# Patient Record
Sex: Male | Born: 1960 | Race: White | Hispanic: No | Smoking: Current every day smoker
Health system: Southern US, Community
[De-identification: ages and names within clinical notes are randomized; demographics above are authoritative.]

## PROBLEM LIST (undated history)

## (undated) DIAGNOSIS — N2889 Other specified disorders of kidney and ureter: Secondary | ICD-10-CM

## (undated) DIAGNOSIS — I219 Acute myocardial infarction, unspecified: Secondary | ICD-10-CM

## (undated) DIAGNOSIS — I509 Heart failure, unspecified: Secondary | ICD-10-CM

## (undated) DIAGNOSIS — E119 Type 2 diabetes mellitus without complications: Secondary | ICD-10-CM

## (undated) DIAGNOSIS — Z86718 Personal history of other venous thrombosis and embolism: Secondary | ICD-10-CM

## (undated) DIAGNOSIS — I10 Essential (primary) hypertension: Secondary | ICD-10-CM

## (undated) DIAGNOSIS — E785 Hyperlipidemia, unspecified: Secondary | ICD-10-CM

## (undated) HISTORY — PX: CARDIAC DEFIBRILLATOR PLACEMENT: SHX171

## (undated) HISTORY — PX: HERNIA REPAIR: SHX51

---

## 2014-04-12 ENCOUNTER — Emergency Department: Payer: Self-pay | Admitting: Emergency Medicine

## 2014-04-18 ENCOUNTER — Emergency Department: Payer: Self-pay | Admitting: Internal Medicine

## 2015-04-27 ENCOUNTER — Other Ambulatory Visit: Payer: Self-pay

## 2015-04-27 ENCOUNTER — Emergency Department
Admission: EM | Admit: 2015-04-27 | Discharge: 2015-04-27 | Discharge status: Against Medical Advice | Payer: Medicaid Other | Attending: Emergency Medicine | Admitting: Emergency Medicine

## 2015-04-27 ENCOUNTER — Emergency Department: Payer: Medicaid Other

## 2015-04-27 ENCOUNTER — Encounter: Payer: Self-pay | Admitting: Emergency Medicine

## 2015-04-27 DIAGNOSIS — I252 Old myocardial infarction: Secondary | ICD-10-CM | POA: Insufficient documentation

## 2015-04-27 DIAGNOSIS — R7989 Other specified abnormal findings of blood chemistry: Secondary | ICD-10-CM | POA: Diagnosis not present

## 2015-04-27 DIAGNOSIS — Z880 Allergy status to penicillin: Secondary | ICD-10-CM | POA: Diagnosis not present

## 2015-04-27 DIAGNOSIS — Y998 Other external cause status: Secondary | ICD-10-CM | POA: Diagnosis not present

## 2015-04-27 DIAGNOSIS — Y9389 Activity, other specified: Secondary | ICD-10-CM | POA: Insufficient documentation

## 2015-04-27 DIAGNOSIS — I10 Essential (primary) hypertension: Secondary | ICD-10-CM | POA: Diagnosis not present

## 2015-04-27 DIAGNOSIS — R778 Other specified abnormalities of plasma proteins: Secondary | ICD-10-CM

## 2015-04-27 DIAGNOSIS — R079 Chest pain, unspecified: Secondary | ICD-10-CM

## 2015-04-27 DIAGNOSIS — Y9289 Other specified places as the place of occurrence of the external cause: Secondary | ICD-10-CM | POA: Diagnosis not present

## 2015-04-27 DIAGNOSIS — S299XXA Unspecified injury of thorax, initial encounter: Secondary | ICD-10-CM | POA: Insufficient documentation

## 2015-04-27 DIAGNOSIS — W1839XA Other fall on same level, initial encounter: Secondary | ICD-10-CM | POA: Diagnosis not present

## 2015-04-27 DIAGNOSIS — Z720 Tobacco use: Secondary | ICD-10-CM | POA: Diagnosis not present

## 2015-04-27 DIAGNOSIS — E119 Type 2 diabetes mellitus without complications: Secondary | ICD-10-CM | POA: Diagnosis not present

## 2015-04-27 HISTORY — DX: Acute myocardial infarction, unspecified: I21.9

## 2015-04-27 HISTORY — DX: Heart failure, unspecified: I50.9

## 2015-04-27 HISTORY — DX: Personal history of other venous thrombosis and embolism: Z86.718

## 2015-04-27 HISTORY — DX: Essential (primary) hypertension: I10

## 2015-04-27 HISTORY — DX: Type 2 diabetes mellitus without complications: E11.9

## 2015-04-27 LAB — CBC
HCT: 36.8 % — ABNORMAL LOW (ref 40.0–52.0)
HEMOGLOBIN: 12.5 g/dL — AB (ref 13.0–18.0)
MCH: 31.2 pg (ref 26.0–34.0)
MCHC: 34 g/dL (ref 32.0–36.0)
MCV: 91.9 fL (ref 80.0–100.0)
PLATELETS: 221 10*3/uL (ref 150–440)
RBC: 4.01 MIL/uL — ABNORMAL LOW (ref 4.40–5.90)
RDW: 13.3 % (ref 11.5–14.5)
WBC: 9.7 10*3/uL (ref 3.8–10.6)

## 2015-04-27 LAB — BASIC METABOLIC PANEL
ANION GAP: 9 (ref 5–15)
BUN: 14 mg/dL (ref 6–20)
CO2: 23 mmol/L (ref 22–32)
Calcium: 8.8 mg/dL — ABNORMAL LOW (ref 8.9–10.3)
Chloride: 99 mmol/L — ABNORMAL LOW (ref 101–111)
Creatinine, Ser: 0.88 mg/dL (ref 0.61–1.24)
GFR calc non Af Amer: >60 mL/min (ref >60)
GLUCOSE: 367 mg/dL — AB (ref 65–99)
POTASSIUM: 4.2 mmol/L (ref 3.5–5.1)
SODIUM: 131 mmol/L — AB (ref 135–145)

## 2015-04-27 LAB — TROPONIN I: Troponin I: 0.11 ng/mL — ABNORMAL HIGH (ref <0.031)

## 2015-04-27 MED ORDER — ASPIRIN 81 MG PO CHEW
324.0000 mg | CHEWABLE_TABLET | Freq: Once | ORAL | Status: AC
  Administered 2015-04-27: 324 mg via ORAL
  Filled 2015-04-27: qty 4

## 2015-04-27 NOTE — ED Notes (Signed)
Pt fell off the top bunk on Sunday , pt with complaining of continuous tingling sensation to ICD region, pt does not recall being shocked.

## 2015-04-27 NOTE — Discharge Instructions (Signed)
As we discussed, your chest pain and your blood work are concerning that you are having a heart attack. Furthermore we did discuss the possibility of death with this condition. You did understand the risk of leaving the hospital at this point and that you could have a massive heart attack or arrhythmia that could lead to death. Please follow-up with your heart doctor as soon as possible. In addition you are always welcome to return to this emergency department if you have changed your mind. Myocardial Infarction A myocardial infarction (MI) is damage to the heart that is not reversible. It is also called a heart attack. An MI usually occurs when a heart (coronary) artery becomes blocked or narrowed. This cuts off the blood supply to the heart. When one or more of the heart (coronary) arteries becomes blocked, that area of the heart begins to die. This causes pain felt during an MI.  If you think you might be having an MI, call your local emergency services immediately (911 in [place] It is recommended that you chew and swallow 3 non-enteric coated baby aspirin if you do not have an aspirin allergy. Do not drive yourself to the hospital or wait to see if your symptoms go away. The sooner MI is treated, the greater the amount of heart muscle saved. Time is muscle. It can save your life. CAUSES  An MI can occur from:  A gradual buildup of a fatty substance called plaque. When plaque builds up in the arteries, this condition is called atherosclerosis. This buildup can block or reduce the blood supply to the heart artery(s).  A sudden plaque rupture within a heart artery that causes a blood clot (thrombus). A blood clot can block the heart artery which does not allow blood flow to the heart.  A severe tightening (spasm) of the heart artery. This is a less common cause of a heart attack. When a heart artery spasms, it cuts off blood flow through the artery. Spasms can occur in heart arteries that do not have  atherosclerosis. RISK FACTORS People at risk for an MI usually have one or more risk factors, such as:  High blood pressure.  High cholesterol.  Smoking.  Gender. Men have a higher heart attack risk.  Overweight/obesity.  Age.  Family history.  Lack of exercise.  Diabetes.  Stress.  Excessive alcohol use.  Street drug use (cocaine and methamphetamines). SYMPTOMS  MI symptoms can vary, such as:  In both men and women, MI symptoms can include the following:  Chest pain. The chest pain may feel like a crushing, squeezing, or "pressure" type feeling. MI pain can be "referred," meaning pain can be caused in one part of the body but felt in another part of the body. Referred MI pain may occur in the left arm, neck, or jaw. Pain may even be felt in the right arm.  Shortness of breath (dyspnea).  Heartburn or indigestion with or without vomiting, shortness of breath, or sweating (diaphoresis).  Sudden, cold sweats.  Sudden lightheadedness.  Upper back pain.  Women can have unique MI symptoms, such as:  Unexplained feelings of nervousness or anxiety.  Discomfort between the shoulder blades (scapula) or upper back.  Tingling in the hands and arms.  In elderly people (regardless of gender), MI symptoms can be subtle, such as:  Sweating (diaphoresis).  Shortness of breath (dyspnea).  General tiredness (fatigue) or not feeling well (malaise). DIAGNOSIS  Diagnosis of an MI involves several tests such as:  An  assessment of your vital signs such as heart rhythm, blood pressure, respiratory rate, and oxygen level.  An EKG (ECG) to look at the electrical activity of your heart.  Blood tests called cardiac markers are drawn at scheduled times to measure proteins or enzymes released by the damaged heart muscle.  A chest X-ray.  An echocardiogram to evaluate heart motion and blood flow.  Coronary angiography (cardiac catheterization). This is a diagnostic procedure  to look at the heart arteries. TREATMENT  Acute Intervention. For an MI, the national standard in the [place] is to have an acute intervention in under 90 minutes from the time you get to the hospital. An acute intervention is a special procedure to open up the heart arteries. It is done in a treatment room called a "catheterization lab" (cath lab). Some hospitals do no have a cath lab. If you are having an MI and the hospital does not have a cath lab, the standard is to transport you to a hospital that has one. In the cath lab, acute intervention includes:  Angioplasty. An angioplasty involves inserting a thin, flexible tube (catheter) into an artery in either your groin or wrist. The catheter is threaded to the heart arteries. A balloon at the end of the catheter is inflated to open a narrowed or blocked heart artery. During an angioplasty procedure, a small mesh tube (stent) may be used to keep the heart artery open. Depending on your condition and health history, one of two types of stents may be placed:  Drug-eluting stent (DES). A DES is coated with a medicine to prevent scar tissue from growing over the stent. With drug-eluting stents, blood thinning medicine will need to be taken for up to a year.  Bare metal stent. This type of stent has no special coating to keep tissue from growing over it. This type of stent is used if you cannot take blood thinning medicine for a prolonged time or you need surgery in the near future. After a bare metal stent is placed, blood thinning medicine will need to be taken for about a month.  If you are taking blood thinning medicine (anti-platelet therapy) after stent placement, do not stop taking it unless your caregiver says it is okay to do so. Make sure you understand how long you need to take the medicine. Surgical Intervention  If an acute intervention is not successful, surgery may be needed:  Open heart surgery (coronary artery bypass graft, CABG).  CABG takes a vein (saphenous vein) from your leg. The vein is then attached to the blocked heart artery which bypasses the blockage. This then allows blood flow to the heart muscle. Additional Interventions  A "clot buster" medicine (thrombolytic) may be given. This medicine can help break up a clot in the heart artery. This medicine may be given if a person cannot get to a cath lab right away.  Intra-aortic balloon pump (IABP). If you have suffered a very severe MI and are too unstable to go to the cath lab or to surgery, an IABP may be used. This is a temporary mechanical device used to increase blood flow to the heart and reduce the workload of the heart until you are stable enough to go to the cath lab or surgery. HOME CARE INSTRUCTIONS After an MI, you may need the following:  Medicine. Take medicine as directed by your caregiver. Medicines after an MI may:  Keep your blood from clotting easily (blood thinners).  Control your blood pressure.  Help lower your cholesterol.  Control abnormal heart rhythms.  Lifestyle changes. Under the guidance of your caregiver, lifestyle changes include:  Quitting smoking, if you smoke. Your caregiver can help you quit.  Being physically active.  Maintaining a healthy weight.  Eating a heart healthy diet. A dietitian can help you learn healthy eating options.  Managing diabetes.  Reducing stress.  Limiting alcohol intake. SEEK IMMEDIATE MEDICAL CARE IF:   You have severe chest pain, especially if the pain is crushing or pressure-like and spreads to the arms, back, neck, or jaw. This is an emergency. Do not wait to see if the pain will go away. Get medical help at once. Call your local emergency services (911 in the [place] Do not drive yourself to the hospital.  You have shortness of breath during rest, sleep, or with activity.  You have sudden sweating or clammy skin.  You feel sick to your stomach (nauseous) and throw up (vomit).  You  suddenly become lightheaded or dizzy.  You feel your heart beating rapidly or you notice "skipped" beats. MAKE SURE YOU:   Understand these instructions.  Will watch your condition.  Will get help right away if you are not doing well or get worse. Document Released: 09/17/2005 Document Revised: 09/22/2013 Document Reviewed: 11/20/2013 [facility] Patient Information 2015 [place], [place]. This information is not intended to replace advice given to you by your health care provider. Make sure you discuss any questions you have with your health care provider.

## 2015-04-27 NOTE — ED Provider Notes (Signed)
[facility] Emergency Department Provider Note  ____________________________________________  Time seen: 1225  I have reviewed the triage vital signs and the nursing notes.   HISTORY  Chief Complaint AICD Problem   History limited by: Not Limited   HPI Mario Gallagher is a 54 y.o. male who presents to the emergency department today because of concerns of chest pain. The patient states that he fell 3 days ago. He states he fell onto his left elbow and arm and felt as if he almost dislocated it. He states since that time he has pain in his left chest. He describes it as a sometimes sharp and sometimes tingling pain. It has been fairly constant. He describes it as starting mid axillary and radiating around to the front of his left chest. He states that he does have an AICD that was placed in June of this year secondary to heart failure. He states he does have a history of heart attacks.He has not had any recent fevers.   Past Medical History  Diagnosis Date  . Hypertension   . Diabetes mellitus without complication   . CHF (congestive heart failure)   . Acute MI   . H/O blood clots     There are no active problems to display for this patient.   Past Surgical History  Procedure Laterality Date  . Cardiac defibrillator placement    . Hernia repair      No current outpatient prescriptions on file.  Allergies Codeine; Penicillins; and Tramadol  No family history on file.  Social History History  Substance Use Topics  . Smoking status: Current Every Day Smoker  . Smokeless tobacco: Not on file  . Alcohol Use: Not on file    Review of Systems  Constitutional: Negative for fever. Cardiovascular: Positive for chest pain. Respiratory: Negative for shortness of breath. Gastrointestinal: Negative for abdominal pain, vomiting and diarrhea. Genitourinary: Negative for dysuria. Musculoskeletal: Negative for back pain. Skin: Negative for  rash. Neurological: Negative for headaches, focal weakness or numbness.  10-point ROS otherwise negative.  ____________________________________________   PHYSICAL EXAM:  VITAL SIGNS: ED Triage Vitals  Enc Vitals Group     BP 04/27/15 1130 143/72 mmHg     Pulse Rate 04/27/15 1130 85     Resp 04/27/15 1130 18     Temp 04/27/15 1130 98.2 F (36.8 C)     Temp Source 04/27/15 1130 Oral     SpO2 04/27/15 1130 99 %     Weight 04/27/15 1130 177 lb (80.287 kg)     Height 04/27/15 1130 5\' 8"  (1.727 m)     Head Cir --      Peak Flow --      Pain Score 04/27/15 1130 8   Constitutional: Alert and oriented. Well appearing and in no distress. Eyes: Conjunctivae are normal. PERRL. Normal extraocular movements. ENT   Head: Normocephalic and atraumatic.   Nose: No congestion/rhinnorhea.   Mouth/Throat: Mucous membranes are moist.   Neck: No stridor. Hematological/Lymphatic/Immunilogical: No cervical lymphadenopathy. Cardiovascular: Normal rate, regular rhythm.  No murmurs, rubs, or gallops. Respiratory: Normal respiratory effort without tachypnea nor retractions. Breath sounds are clear and equal bilaterally. No wheezes/rales/rhonchi. Gastrointestinal: Soft and nontender. No distention.  Genitourinary: Deferred Musculoskeletal: Normal range of motion in all extremities. No joint effusions.  No lower extremity tenderness nor edema. Neurologic:  Normal speech and language. No gross focal neurologic deficits are appreciated. Speech is normal.  Skin:  Skin is warm, dry and intact. No rash  noted. Psychiatric: Mood and affect are normal. Speech and behavior are normal. Patient exhibits appropriate insight and judgment.  ____________________________________________    LABS (pertinent positives/negatives)  Labs Reviewed  BASIC METABOLIC PANEL - Abnormal; Notable for the following:    Sodium 131 (*)    Chloride 99 (*)    Glucose, Bld 367 (*)    Calcium 8.8 (*)    All other  components within normal limits  TROPONIN I - Abnormal; Notable for the following:    Troponin I 0.11 (*)    All other components within normal limits  CBC - Abnormal; Notable for the following:    RBC 4.01 (*)    Hemoglobin 12.5 (*)    HCT 36.8 (*)    All other components within normal limits     ____________________________________________   EKG  I, Phineas Semen, attending physician, personally viewed and interpreted this EKG  EKG Time: 1133 Rate: 87 Rhythm: paced Axis: normal Intervals: qtc 498 QRS: narrow ST changes: no st elevation  ____________________________________________    RADIOLOGY  Chest x-ray IMPRESSION: No active cardiopulmonary disease. ____________________________________________   PROCEDURES  Procedure(s) performed: None  Critical Care performed: No  ____________________________________________   INITIAL IMPRESSION / ASSESSMENT AND PLAN / ED COURSE  Pertinent labs & imaging results that were available during my care of the patient were reviewed by me and considered in my medical decision making (see chart for details).  Patient presented to the emergency department today because of continued pain and tingling to his left chest after a fall. EKG without any concerning findings. Patient's troponin was elevated. I did have a long discussion with the patient about my concern that he is suffering heart damage. At this point however he states that he only wants his heart doctor at [facility] to take care of it. He requested that he be discharged from the hospital so his family could take him to [facility]. I did state that I was not comfortable with discharging him from the emergency department given his symptoms and elevated troponin. We did discuss possibility of sudden death either from a heart attack or dysrhythmia. I again strongly urged and recommended that the patient stay in the emergency department for admission and further evaluation. Patient verbalized  understanding of risks and again requested that he be discharged from the hospital. This point I will have the patient sign out AMA. We will encourage patient to follow-up immediately with [facility] cardiology team.  Of note, upon review of care everywhere I do not see any notes from [facility]. Patient had left prior to my discussion with him about this.  ____________________________________________   FINAL CLINICAL IMPRESSION(S) / ED DIAGNOSES  Final diagnoses:  Chest pain, unspecified chest pain type  Elevated troponin     Phineas Semen, MD 04/27/15 1704

## 2015-04-29 ENCOUNTER — Other Ambulatory Visit: Payer: Self-pay

## 2015-04-29 ENCOUNTER — Encounter: Payer: Self-pay | Admitting: Emergency Medicine

## 2015-04-29 ENCOUNTER — Emergency Department
Admission: EM | Admit: 2015-04-29 | Discharge: 2015-04-29 | Discharge status: Against Medical Advice | Payer: Medicaid Other | Attending: Emergency Medicine | Admitting: Emergency Medicine

## 2015-04-29 DIAGNOSIS — I10 Essential (primary) hypertension: Secondary | ICD-10-CM | POA: Insufficient documentation

## 2015-04-29 DIAGNOSIS — Y831 Surgical operation with implant of artificial internal device as the cause of abnormal reaction of the patient, or of later complication, without mention of misadventure at the time of the procedure: Secondary | ICD-10-CM | POA: Insufficient documentation

## 2015-04-29 DIAGNOSIS — Z880 Allergy status to penicillin: Secondary | ICD-10-CM | POA: Insufficient documentation

## 2015-04-29 DIAGNOSIS — R079 Chest pain, unspecified: Secondary | ICD-10-CM | POA: Diagnosis present

## 2015-04-29 DIAGNOSIS — F911 Conduct disorder, childhood-onset type: Secondary | ICD-10-CM | POA: Diagnosis not present

## 2015-04-29 DIAGNOSIS — T82198A Other mechanical complication of other cardiac electronic device, initial encounter: Secondary | ICD-10-CM | POA: Insufficient documentation

## 2015-04-29 DIAGNOSIS — E119 Type 2 diabetes mellitus without complications: Secondary | ICD-10-CM | POA: Insufficient documentation

## 2015-04-29 DIAGNOSIS — Z720 Tobacco use: Secondary | ICD-10-CM | POA: Diagnosis not present

## 2015-04-29 NOTE — ED Notes (Signed)
Pt states he was seen here 2 days ago and his ICD has been firing and causing pain.  States he has a f/u with his MD on next Friday.  Pt refuses EKG and repeat blood draw at triage.  States " this is some stupid shit, I just need my medicines".  Pt request refill percocet and klonopin.  Skin w/d with good color.

## 2015-04-29 NOTE — ED Provider Notes (Signed)
[facility] Emergency Department Provider Note     Time seen: ----------------------------------------- 12:43 PM on 04/29/2015 -----------------------------------------    I have reviewed the triage vital signs and the nursing notes.   HISTORY  Chief Complaint Chest Pain    HPI Mario Gallagher is a 54 y.o. male who presents ER stating that his AICD is firing and causing him pain. Patient requesting Percocets for pain and will not let anyonedraw blood or get vital signs. [facility] states[facility] was just seen 2 days ago and does not want anyone to look at his ICD.   Past Medical History  Diagnosis Date  . Hypertension   . Diabetes mellitus without complication   . CHF (congestive heart failure)   . Acute MI   . H/O blood clots     There are no active problems to display for this patient.   Past Surgical History  Procedure Laterality Date  . Cardiac defibrillator placement    . Hernia repair      Allergies Codeine; Penicillins; and Tramadol  Social History History  Substance Use Topics  . Smoking status: Current Every Day Smoker  . Smokeless tobacco: Not on file  . Alcohol Use: No    Review of Systems Constitutional: Negative for fever. Eyes: Negative for visual changes. ENT: Negative for sore throat. Cardiovascular: Positive for chest pain Respiratory: Negative for shortness of breath. Gastrointestinal: Negative for abdominal pain, vomiting and diarrhea. Genitourinary: Negative for dysuria. Musculoskeletal: Negative for back pain. Skin: Negative for rash. Neurological: Negative for headaches, focal weakness or numbness.  10-point ROS otherwise negative.  ____________________________________________   PHYSICAL EXAM:  VITAL SIGNS: ED Triage Vitals  Enc Vitals Group     BP 04/29/15 1233 132/80 mmHg     Pulse Rate 04/29/15 1233 88     Resp 04/29/15 1233 18     Temp 04/29/15 1233 98.5 F (36.9 C)   Temp Source 04/29/15 1233 Oral     SpO2 04/29/15 1233 98 %     Weight 04/29/15 1233 177 lb (80.287 kg)     Height 04/29/15 1233  (1.727 m)     Head Cir --      Peak Flow --      Pain Score 04/29/15 1237 10     Pain Loc --      Pain Edu? --      Excl. in GC? --     Constitutional: Alert and oriented. Well appearing and in no distress. Eyes: Conjunctivae are normal. PERRL. Normal extraocular movements. ENT   Head: Normocephalic and atraumatic.   Nose: No congestion/rhinnorhea.   Mouth/Throat: Mucous membranes are moist.   Neck: No stridor. Cardiovascular: Normal rate, regular rhythm. Normal and symmetric distal pulses are present in all extremities. No murmurs, rubs, or gallops. Respiratory: Normal respiratory effort without tachypnea nor retractions. Breath sounds are clear and equal bilaterally. No wheezes/rales/rhonchi. Gastrointestinal: Soft and nontender. No distention. No abdominal bruits.  Musculoskeletal: Nontender with normal range of motion in all extremities. No joint effusions.  No lower extremity tenderness nor edema. Neurologic:  Normal speech and language. No gross focal neurologic deficits are appreciated. Speech is normal. No gait instability. Skin:  Skin is warm, dry and intact. No rash noted. Psychiatric: Patient with aggressive behavior, very antagonistic. ____________________________________________  EKG: Interpreted by me. Rate is 79 bpm, atrial sensing ventricular paced rhythm.  ____________________________________________  ED COURSE:  Pertinent labs & imaging results that were available during my care of the  patient were reviewed by me and considered in my medical decision making (see chart for details). I attempted to talk patient into having his ICD interrogated the patient began cursing and threatening me. Patient repeatedly asking for a prescription for narcotics to go back to the [facility] with. He is advised we do not manage  chronic pain and that this would have to come from his primary care doctor. Patient abruptly ripped off his telemetry leads and left against medical device ____________________________________________     FINAL ASSESSMENT AND PLAN  Chest pain, ICD firing  Plan: Patient with labs and imaging as dictated above. Patient again refused any treatment here. Advised that we could not guarantee his safety that he needed ICD interrogation. Patient was cursing and threatening me with lawsuits as he walked out of the ER. Police was notified.   Emily Filbert, MD   Emily Filbert, MD 04/29/15 [phone]

## 2015-04-29 NOTE — ED Notes (Signed)
Pt walked out AMA while MD at bedside. Pt said to RN while walking out "I ain't signing any of your shit" and walked out. Pt left at this time.

## 2015-04-29 NOTE — ED Notes (Signed)
MD Williams at bedside.  

## 2017-04-24 ENCOUNTER — Emergency Department
Admission: EM | Admit: 2017-04-24 | Discharge: 2017-04-24 | Disposition: A | Discharge status: Home / Self Care | Payer: Medicaid Other | Attending: Emergency Medicine | Admitting: Emergency Medicine

## 2017-04-24 ENCOUNTER — Encounter: Payer: Self-pay | Admitting: Emergency Medicine

## 2017-04-24 ENCOUNTER — Emergency Department: Payer: Medicaid Other

## 2017-04-24 DIAGNOSIS — F17210 Nicotine dependence, cigarettes, uncomplicated: Secondary | ICD-10-CM | POA: Diagnosis not present

## 2017-04-24 DIAGNOSIS — N451 Epididymitis: Secondary | ICD-10-CM | POA: Insufficient documentation

## 2017-04-24 DIAGNOSIS — I509 Heart failure, unspecified: Secondary | ICD-10-CM | POA: Insufficient documentation

## 2017-04-24 DIAGNOSIS — R079 Chest pain, unspecified: Secondary | ICD-10-CM | POA: Diagnosis present

## 2017-04-24 DIAGNOSIS — R109 Unspecified abdominal pain: Secondary | ICD-10-CM | POA: Insufficient documentation

## 2017-04-24 DIAGNOSIS — I110 Hypertensive heart disease with heart failure: Secondary | ICD-10-CM | POA: Insufficient documentation

## 2017-04-24 DIAGNOSIS — I252 Old myocardial infarction: Secondary | ICD-10-CM | POA: Insufficient documentation

## 2017-04-24 DIAGNOSIS — E119 Type 2 diabetes mellitus without complications: Secondary | ICD-10-CM | POA: Diagnosis not present

## 2017-04-24 DIAGNOSIS — N50819 Testicular pain, unspecified: Secondary | ICD-10-CM

## 2017-04-24 HISTORY — DX: Other specified disorders of kidney and ureter: N28.89

## 2017-04-24 HISTORY — DX: Hyperlipidemia, unspecified: E78.5

## 2017-04-24 LAB — CBC
HCT: 40.2 % (ref 40.0–52.0)
Hemoglobin: 13.8 g/dL (ref 13.0–18.0)
MCH: 31.8 pg (ref 26.0–34.0)
MCHC: 34.3 g/dL (ref 32.0–36.0)
MCV: 93 fL (ref 80.0–100.0)
PLATELETS: 249 10*3/uL (ref 150–440)
RBC: 4.33 MIL/uL — ABNORMAL LOW (ref 4.40–5.90)
RDW: 13.6 % (ref 11.5–14.5)
WBC: 10.1 10*3/uL (ref 3.8–10.6)

## 2017-04-24 LAB — URINE DRUG SCREEN, QUALITATIVE (ARMC ONLY)
Amphetamines, Ur Screen: NOT DETECTED
BENZODIAZEPINE, UR SCRN: NOT DETECTED
Barbiturates, Ur Screen: NOT DETECTED
CANNABINOID 50 NG, UR ~~LOC~~: POSITIVE — AB
Cocaine Metabolite,Ur ~~LOC~~: NOT DETECTED
MDMA (Ecstasy)Ur Screen: NOT DETECTED
Methadone Scn, Ur: NOT DETECTED
Opiate, Ur Screen: NOT DETECTED
Phencyclidine (PCP) Ur S: NOT DETECTED
Tricyclic, Ur Screen: NOT DETECTED

## 2017-04-24 LAB — BASIC METABOLIC PANEL
Anion gap: 10 (ref 5–15)
BUN: 19 mg/dL (ref 6–20)
CO2: 24 mmol/L (ref 22–32)
CREATININE: 1.05 mg/dL (ref 0.61–1.24)
Calcium: 9.6 mg/dL (ref 8.9–10.3)
Chloride: 97 mmol/L — ABNORMAL LOW (ref 101–111)
GFR calc non Af Amer: >60 mL/min (ref >60)
Glucose, Bld: 531 mg/dL (ref 65–99)
Potassium: 4.3 mmol/L (ref 3.5–5.1)
SODIUM: 131 mmol/L — AB (ref 135–145)

## 2017-04-24 LAB — GLUCOSE, CAPILLARY
GLUCOSE-CAPILLARY: 190 mg/dL — AB (ref 65–99)
Glucose-Capillary: 512 mg/dL (ref 65–99)

## 2017-04-24 LAB — TROPONIN I: Troponin I: 0.03 ng/mL (ref <0.03)

## 2017-04-24 LAB — ETHANOL

## 2017-04-24 MED ORDER — TRAMADOL HCL 50 MG PO TABS
ORAL_TABLET | ORAL | Status: AC
  Filled 2017-04-24: qty 1

## 2017-04-24 MED ORDER — ONDANSETRON HCL 4 MG/2ML IJ SOLN
4.0000 mg | Freq: Once | INTRAMUSCULAR | Status: AC
  Administered 2017-04-24: 4 mg via INTRAVENOUS
  Filled 2017-04-24: qty 2

## 2017-04-24 MED ORDER — IOPAMIDOL (ISOVUE-300) INJECTION 61%
100.0000 mL | Freq: Once | INTRAVENOUS | Status: AC | PRN
  Administered 2017-04-24: 100 mL via INTRAVENOUS

## 2017-04-24 MED ORDER — LEVOFLOXACIN 500 MG PO TABS: 500.0000 mg | ORAL_TABLET | Freq: Every day | ORAL | 0 refills | Status: AC

## 2017-04-24 MED ORDER — INSULIN ASPART 100 UNIT/ML ~~LOC~~ SOLN
8.0000 [IU] | Freq: Once | SUBCUTANEOUS | Status: AC
  Administered 2017-04-24: 8 [IU] via INTRAVENOUS
  Filled 2017-04-24: qty 1

## 2017-04-24 MED ORDER — MORPHINE SULFATE (PF) 2 MG/ML IV SOLN
2.0000 mg | Freq: Once | INTRAVENOUS | Status: AC
  Administered 2017-04-24: 2 mg via INTRAVENOUS
  Filled 2017-04-24: qty 1

## 2017-04-24 MED ORDER — OXYCODONE HCL 5 MG PO TABS: 5.0000 mg | ORAL_TABLET | Freq: Three times a day (TID) | ORAL | 0 refills | Status: AC | PRN

## 2017-04-24 MED ORDER — OXYCODONE-ACETAMINOPHEN 5-325 MG PO TABS: 1.0000 | ORAL_TABLET | Freq: Once | ORAL | Status: DC

## 2017-04-24 MED ORDER — OXYCODONE HCL 5 MG PO TABS
5.0000 mg | ORAL_TABLET | Freq: Once | ORAL | Status: AC
  Administered 2017-04-24: 5 mg via ORAL

## 2017-04-24 MED ORDER — SODIUM CHLORIDE 0.9 % IV BOLUS (SEPSIS)
500.0000 mL | Freq: Once | INTRAVENOUS | Status: AC
  Administered 2017-04-24: 500 mL via INTRAVENOUS

## 2017-04-24 MED ORDER — OXYCODONE HCL 5 MG PO TABS
ORAL_TABLET | ORAL | Status: AC
  Administered 2017-04-24: 5 mg via ORAL
  Filled 2017-04-24: qty 1

## 2017-04-24 NOTE — ED Triage Notes (Signed)
Pt to ED from home c/o chest pain x3 days.  States had n/v as well.  States pain down mid abd with hx of hernia.  Recently released from jail and has not been taking medication, insulin, or had pacemaker interrogated recently.

## 2017-04-24 NOTE — ED Provider Notes (Signed)
[facility] Emergency Department Provider Note   First MD Initiated Contact with Patient 04/24/17 [phone]     (approximate)  I have reviewed the triage vital signs and the nursing notes.  Patient is a very poor historian HISTORY  Chief Complaint Chest Pain    HPI Mario Gallagher is a 56 y.o. male presents to the emergency department with "belly pain that goes all the way to my nuts". Patient states that he believes his hernia has ruptured and is extended all the way down to his scrotum. Patient denies any chest pain. Patient does however admit to nausea and nonbloody vomiting seen at his current pain score is 10 out of 10. Patient states that he was recently released from jail and admits to being noncompliant with his medications including insulin.    Past Medical History:  Diagnosis Date  . Acute MI (HCC)   . CHF (congestive heart failure) (HCC)   . Diabetes mellitus without complication (HCC)   . H/O blood clots   . Hyperlipemia   . Hypertension   . Left kidney mass     There are no active problems to display for this patient.   Past Surgical History:  Procedure Laterality Date  . CARDIAC DEFIBRILLATOR PLACEMENT    . HERNIA REPAIR      Prior to Admission medications   Not on File    Allergies Codeine; Penicillins; and Tramadol  History reviewed. No pertinent family history.  Social History Social History  Substance Use Topics  . Smoking status: Current Every Day Smoker    Packs/day: 0.50    Types: Cigarettes  . Smokeless tobacco: Never Used  . Alcohol use No    Review of Systems Constitutional: No fever/chills Eyes: No visual changes. ENT: No sore throat. Cardiovascular: Denies chest pain. Respiratory: Denies shortness of breath. GastrointestinalPositive for abdominal pain nausea and vomiting  Genitourinary: Negative for dysuria. Positive for right scrotal pain Musculoskeletal: Negative for neck pain.  Negative for back  pain. Integumentary: Negative for rash. Neurological: Negative for headaches, focal weakness or numbness.   ____________________________________________   PHYSICAL EXAM:  VITAL SIGNS: ED Triage Vitals  Enc Vitals Group     BP 04/24/17 0022 (!) 144/66     Pulse Rate 04/24/17 0022 (!) 102     Resp 04/24/17 0022 20     Temp 04/24/17 0022 97.8 F (36.6 C)     Temp Source 04/24/17 0022 Oral     SpO2 04/24/17 0022 97 %     Weight 04/24/17 0015 90.7 kg (200 lb)     Height 04/24/17 0015 1.727 m (5\' 8" )     Head Circumference --      Peak Flow --      Pain Score 04/24/17 0015 7     Pain Loc --      Pain Edu? --      Excl. in GC? --    Constitutional: Alert and oriented. Well appearing and in no acute distress. Eyes: Conjunctivae are normal. PERRL. EOMI. Head: Atraumatic. Mouth/Throat: Mucous membranes are moist. Oropharynx non-erythematous. Neck: No stridor.   Cardiovascular: Normal rate, regular rhythm. Good peripheral circulation. Grossly normal heart sounds. Respiratory: Normal respiratory effort.  No retractions. Lungs CTAB. Gastrointestinal: Generalized tenderness with very mild palpation. No distention.  Genitourinary: Right testicular pain with palpation of the epididymis Musculoskeletal: No lower extremity tenderness nor edema. No gross deformities of extremities. Neurologic:  Normal speech and language. No gross focal neurologic deficits are appreciated.  Skin:  Skin is warm, dry and intact. No rash noted. Psychiatric: Mood and affect are normal. Speech and behavior are normal.  ____________________________________________   LABS (all labs ordered are listed, but only abnormal results are displayed)  Labs Reviewed  BASIC METABOLIC PANEL - Abnormal; Notable for the following:       Result Value   Sodium 131 (*)    Chloride 97 (*)    Glucose, Bld 531 (*)    All other components within normal limits  CBC - Abnormal; Notable for the following:    RBC 4.33 (*)     All other components within normal limits  TROPONIN I - Abnormal; Notable for the following:    Troponin I 0.03 (*)    All other components within normal limits  GLUCOSE, CAPILLARY - Abnormal; Notable for the following:    Glucose-Capillary 512 (*)    All other components within normal limits  URINE DRUG SCREEN, QUALITATIVE ([facility] ONLY) - Abnormal; Notable for the following:    Cannabinoid 50 Ng, Ur [place] POSITIVE (*)    All other components within normal limits  ETHANOL   ____________________________________________  EKG  ED ECG REPORT I, [place] N Emya Picado, the attending physician, personally viewed and interpreted this ECG.   Date: 04/24/2017  EKG Time: 12:14 AM  Rate: 103  Rhythm: Atrial paced rhythm  Axis: Normal  Intervals: Normal  ST&T Change: None  ____________________________________________  RADIOLOGY I, [place] Jonell Krontz, personally viewed and evaluated these images (plain radiographs) as part of my medical decision making, as well as reviewing the written report by the radiologist.  Dg Chest 2 View  Result Date: 04/24/2017 CLINICAL DATA:  Chest pain for 3 days, extending down into the mid abdomen. EXAM: CHEST  2 VIEW COMPARISON:  04/27/2015 FINDINGS: Grossly intact transvenous cardiac leads. The lungs are clear. The pulmonary vasculature is normal. Heart size is normal. Hilar and mediastinal contours are unremarkable. There is no pleural effusion. IMPRESSION: No active cardiopulmonary disease. Electronically Signed   By: Ellery Plunk M.D.   On: 04/24/2017 00:59   US Scrotum  Result Date: 04/24/2017 CLINICAL DATA:  Bilateral testicular pain for 1 week EXAM: SCROTAL ULTRASOUND DOPPLER ULTRASOUND OF THE TESTICLES TECHNIQUE: Complete ultrasound examination of the testicles, epididymis, and other scrotal structures was performed. Color and spectral Doppler ultrasound were also utilized to evaluate blood flow to the testicles. COMPARISON:  None. FINDINGS: Right testicle  Measurements: 4.0 x 2.4 x 2.9 cm. No mass or microlithiasis visualized. Left testicle Measurements: 4.3 x 2.3 x 2.7 cm. No mass or microlithiasis visualized. Right epididymis: Moderately enlarged. Markedly hyperemic on Doppler. No focal lesion. Left epididymis:  Normal in size and appearance. Hydrocele:  Small bilateral hydroceles. Varicocele:  None visualized. Pulsed Doppler interrogation of both testes demonstrates normal low resistance arterial and venous waveforms bilaterally. IMPRESSION: 1. No testicular mass or torsion. 2. Enlarged right epididymis with marked hyperemia on Doppler. This may represent epididymitis. 3. Small hydroceles bilaterally. Electronically Signed   By: Ellery Plunk M.D.   On: 04/24/2017 02:34   Ct Abdomen Pelvis W Contrast  Result Date: 04/24/2017 CLINICAL DATA:  Generalized abdominal pain. EXAM: CT ABDOMEN AND PELVIS WITH CONTRAST TECHNIQUE: Multidetector CT imaging of the abdomen and pelvis was performed using the standard protocol following bolus administration of intravenous contrast. CONTRAST:  ISOVUE-300 IOPAMIDOL (ISOVUE-300) INJECTION 61% COMPARISON:  None. FINDINGS: Lower chest: Pacemaker partially included. There is a 2.4 cm low-density structure within the left ventricular apex. Small hiatal hernia.  Hepatobiliary: Low-density adjacent with falciform ligament consistent with focal fatty infiltration. No suspicious hepatic lesion. Clips in the gallbladder fossa postcholecystectomy. No biliary dilatation. Pancreas: No ductal dilatation or inflammation. Spleen: Normal in size without focal abnormality. Adrenals/Urinary Tract: 19 mm left adrenal nodule. The right adrenal gland is normal. Ill-defined cortical low-density within the lower right kidney may be scarring, however is nonspecific. No hydronephrosis. There is mild symmetric perinephric edema. Symmetric excretion on delayed phase imaging. Urinary bladder is physiologically distended without wall thickening.  Stomach/Bowel: Small hiatal hernia. No bowel inflammation, wall wall thickening or obstruction. Normal appendix. Vascular/Lymphatic: Aortic atherosclerosis without aneurysm. No adenopathy. Reproductive: Heterogeneous prostate gland. Other: Small fat containing umbilical hernia. Fat within both inguinal canals. No free air, free fluid, or intra-abdominal fluid collection. Musculoskeletal: There are no acute or suspicious osseous abnormalities. IMPRESSION: 1. No specific findings in the abdomen/pelvis to explain abdominal pain. 2. Low-density structure within the cardiac left ventricle at the apex. A large pedunculated left ventricular thrombus was described on echocardiogram dated 11/26/2013 from an outside institution. This is likely chronic, and would be better assessed with echocardiogram. 3. Left adrenal nodule measuring 19 mm, unchanged in size from prior exam, previously characterized as adenoma. 4. Ill-defined low density in the lower right kidney, may be secondary to scarring but is nonspecific. 5.  Aortic Atherosclerosis (ICD10-I70.0). Electronically Signed   By: Rubye OaksMelanie  Ehinger M.D.   On: 04/24/2017 02:49   [place] Art/ven Flow Abd Pelv Doppler  Result Date: 04/24/2017 CLINICAL DATA:  Bilateral testicular pain for 1 week EXAM: SCROTAL ULTRASOUND DOPPLER ULTRASOUND OF THE TESTICLES TECHNIQUE: Complete ultrasound examination of the testicles, epididymis, and other scrotal structures was performed. Color and spectral Doppler ultrasound were also utilized to evaluate blood flow to the testicles. COMPARISON:  None. FINDINGS: Right testicle Measurements: 4.0 x 2.4 x 2.9 cm. No mass or microlithiasis visualized. Left testicle Measurements: 4.3 x 2.3 x 2.7 cm. No mass or microlithiasis visualized. Right epididymis: Moderately enlarged. Markedly hyperemic on Doppler. No focal lesion. Left epididymis:  Normal in size and appearance. Hydrocele:  Small bilateral hydroceles. Varicocele:  None visualized. Pulsed Doppler  interrogation of both testes demonstrates normal low resistance arterial and venous waveforms bilaterally. IMPRESSION: 1. No testicular mass or torsion. 2. Enlarged right epididymis with marked hyperemia on Doppler. This may represent epididymitis. 3. Small hydroceles bilaterally. Electronically Signed   By: Ellery Plunkaniel R Mitchell M.D.   On: 04/24/2017 02:34      Procedures   ____________________________________________   INITIAL IMPRESSION / ASSESSMENT AND PLAN / ED COURSE  Pertinent labs & imaging results that were available during my care of the patient were reviewed by me and considered in my medical decision making (see chart for details).   56 year old male presenting with abdominal pain and right scrotal type pain. Patient denied any chest pain. Patient's pacemaker was interrogated his E mention that he has felt palpitations. No abnormality noted following pacemaker interrogation. Patient's laboratory data notable for glucose of 531 for which she received 8 units of IV insulin with resultant glucose 190. Patient's ultrasound of the scrotum revealed right epididymitis which is consistent with clinical exam. Patient be given Levaquin and prescribed Levaquin for home. In addition patient prescribed Roxicet for home.     ____________________________________________  FINAL CLINICAL IMPRESSION(S) / ED DIAGNOSES  Final diagnoses:  Epididymitis     MEDICATIONS GIVEN DURING THIS VISIT:  Medications  insulin aspart (novoLOG) injection 8 Units (8 Units Intravenous Given 04/24/17 0123)  sodium chloride 0.9 % bolus  500 mL (500 mLs Intravenous New Bag/Given 04/24/17 0127)  morphine 2 MG/ML injection 2 mg (2 mg Intravenous Given 04/24/17 0125)  ondansetron (ZOFRAN) injection 4 mg (4 mg Intravenous Given 04/24/17 0125)  iopamidol (ISOVUE-300) 61 % injection 100 mL (100 mLs Intravenous Contrast Given 04/24/17 0205)     NEW OUTPATIENT MEDICATIONS STARTED DURING THIS VISIT:  New Prescriptions    No medications on file    Modified Medications   No medications on file    Discontinued Medications   No medications on file     Note:  This document was prepared using Dragon voice recognition software and may include unintentional dictation errors.    Darci CurrentBrown, [place] N, MD 04/24/17 [phone]
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
## 2018-01-30 DIAGNOSIS — Y906 Blood alcohol level of 120-199 mg/100 ml: Secondary | ICD-10-CM | POA: Insufficient documentation

## 2018-01-30 DIAGNOSIS — I509 Heart failure, unspecified: Secondary | ICD-10-CM | POA: Insufficient documentation

## 2018-01-30 DIAGNOSIS — I110 Hypertensive heart disease with heart failure: Secondary | ICD-10-CM | POA: Diagnosis not present

## 2018-01-30 DIAGNOSIS — F1410 Cocaine abuse, uncomplicated: Secondary | ICD-10-CM | POA: Diagnosis not present

## 2018-01-30 DIAGNOSIS — E119 Type 2 diabetes mellitus without complications: Secondary | ICD-10-CM | POA: Insufficient documentation

## 2018-01-30 DIAGNOSIS — I252 Old myocardial infarction: Secondary | ICD-10-CM | POA: Insufficient documentation

## 2018-01-30 DIAGNOSIS — R4781 Slurred speech: Secondary | ICD-10-CM | POA: Insufficient documentation

## 2018-01-30 DIAGNOSIS — F10129 Alcohol abuse with intoxication, unspecified: Secondary | ICD-10-CM | POA: Insufficient documentation

## 2018-01-30 DIAGNOSIS — F17210 Nicotine dependence, cigarettes, uncomplicated: Secondary | ICD-10-CM | POA: Insufficient documentation

## 2018-01-30 DIAGNOSIS — Z95810 Presence of automatic (implantable) cardiac defibrillator: Secondary | ICD-10-CM | POA: Insufficient documentation

## 2018-01-31 ENCOUNTER — Emergency Department: Payer: Medicaid - Out of State

## 2018-01-31 ENCOUNTER — Emergency Department
Admission: EM | Admit: 2018-01-31 | Discharge: 2018-01-31 | Disposition: A | Discharge status: Home / Self Care | Payer: Medicaid - Out of State | Attending: Emergency Medicine | Admitting: Emergency Medicine

## 2018-01-31 DIAGNOSIS — F1410 Cocaine abuse, uncomplicated: Secondary | ICD-10-CM

## 2018-01-31 DIAGNOSIS — F10929 Alcohol use, unspecified with intoxication, unspecified: Secondary | ICD-10-CM

## 2018-01-31 LAB — COMPREHENSIVE METABOLIC PANEL
ALT: 28 U/L (ref 17–63)
AST: 31 U/L (ref 15–41)
Albumin: 4.1 g/dL (ref 3.5–5.0)
Alkaline Phosphatase: 147 U/L — ABNORMAL HIGH (ref 38–126)
Anion gap: 11 (ref 5–15)
BILIRUBIN TOTAL: 0.6 mg/dL (ref 0.3–1.2)
BUN: 9 mg/dL (ref 6–20)
CHLORIDE: 100 mmol/L — AB (ref 101–111)
CO2: 24 mmol/L (ref 22–32)
Calcium: 9.1 mg/dL (ref 8.9–10.3)
Creatinine, Ser: 0.74 mg/dL (ref 0.61–1.24)
Glucose, Bld: 363 mg/dL — ABNORMAL HIGH (ref 65–99)
POTASSIUM: 3.7 mmol/L (ref 3.5–5.1)
Sodium: 135 mmol/L (ref 135–145)
TOTAL PROTEIN: 7.1 g/dL (ref 6.5–8.1)

## 2018-01-31 LAB — CBC WITH DIFFERENTIAL/PLATELET
BASOS ABS: 0.1 10*3/uL (ref 0–0.1)
BASOS PCT: 1 %
EOS PCT: 1 %
Eosinophils Absolute: 0.1 10*3/uL (ref 0–0.7)
HCT: 40.5 % (ref 40.0–52.0)
Hemoglobin: 13.9 g/dL (ref 13.0–18.0)
LYMPHS PCT: 40 %
Lymphs Abs: 4 10*3/uL — ABNORMAL HIGH (ref 1.0–3.6)
MCH: 33.5 pg (ref 26.0–34.0)
MCHC: 34.3 g/dL (ref 32.0–36.0)
MCV: 97.7 fL (ref 80.0–100.0)
MONO ABS: 0.8 10*3/uL (ref 0.2–1.0)
Monocytes Relative: 8 %
NEUTROS ABS: 5 10*3/uL (ref 1.4–6.5)
Neutrophils Relative %: 50 %
PLATELETS: 215 10*3/uL (ref 150–440)
RBC: 4.15 MIL/uL — ABNORMAL LOW (ref 4.40–5.90)
RDW: 15 % — AB (ref 11.5–14.5)
WBC: 10 10*3/uL (ref 3.8–10.6)

## 2018-01-31 LAB — ETHANOL: ALCOHOL ETHYL (B): 190 mg/dL — AB (ref <10)

## 2018-01-31 LAB — URINE DRUG SCREEN, QUALITATIVE (ARMC ONLY)
AMPHETAMINES, UR SCREEN: NOT DETECTED
BENZODIAZEPINE, UR SCRN: NOT DETECTED
Barbiturates, Ur Screen: NOT DETECTED
Cannabinoid 50 Ng, Ur ~~LOC~~: NOT DETECTED
Cocaine Metabolite,Ur ~~LOC~~: POSITIVE — AB
MDMA (ECSTASY) UR SCREEN: NOT DETECTED
METHADONE SCREEN, URINE: NOT DETECTED
Opiate, Ur Screen: NOT DETECTED
Phencyclidine (PCP) Ur S: NOT DETECTED
Tricyclic, Ur Screen: NOT DETECTED

## 2018-01-31 NOTE — ED Notes (Signed)
Pt sound asleep in hall bed.
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
## 2018-01-31 NOTE — ED Notes (Signed)
Patient woke up and needed to use restroom. This Clinical research associate assisted patient to restroom.
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
## 2018-05-19 ENCOUNTER — Inpatient Hospital Stay
Admission: EM | Admit: 2018-05-19 | Discharge: 2018-05-20 | DRG: 292 | Disposition: A | Discharge status: Home / Self Care | Payer: Medicaid - Out of State | Attending: Internal Medicine | Admitting: Internal Medicine

## 2018-05-19 ENCOUNTER — Other Ambulatory Visit: Payer: Self-pay

## 2018-05-19 ENCOUNTER — Emergency Department: Payer: Medicaid - Out of State

## 2018-05-19 DIAGNOSIS — R079 Chest pain, unspecified: Secondary | ICD-10-CM | POA: Diagnosis present

## 2018-05-19 DIAGNOSIS — E785 Hyperlipidemia, unspecified: Secondary | ICD-10-CM | POA: Diagnosis present

## 2018-05-19 DIAGNOSIS — N2889 Other specified disorders of kidney and ureter: Secondary | ICD-10-CM | POA: Diagnosis present

## 2018-05-19 DIAGNOSIS — Z7901 Long term (current) use of anticoagulants: Secondary | ICD-10-CM | POA: Diagnosis not present

## 2018-05-19 DIAGNOSIS — I252 Old myocardial infarction: Secondary | ICD-10-CM | POA: Diagnosis not present

## 2018-05-19 DIAGNOSIS — I5023 Acute on chronic systolic (congestive) heart failure: Secondary | ICD-10-CM | POA: Diagnosis present

## 2018-05-19 DIAGNOSIS — N179 Acute kidney failure, unspecified: Secondary | ICD-10-CM | POA: Diagnosis present

## 2018-05-19 DIAGNOSIS — F17210 Nicotine dependence, cigarettes, uncomplicated: Secondary | ICD-10-CM | POA: Diagnosis present

## 2018-05-19 DIAGNOSIS — Z9889 Other specified postprocedural states: Secondary | ICD-10-CM

## 2018-05-19 DIAGNOSIS — D638 Anemia in other chronic diseases classified elsewhere: Secondary | ICD-10-CM | POA: Diagnosis present

## 2018-05-19 DIAGNOSIS — R0989 Other specified symptoms and signs involving the circulatory and respiratory systems: Secondary | ICD-10-CM | POA: Diagnosis present

## 2018-05-19 DIAGNOSIS — Z885 Allergy status to narcotic agent status: Secondary | ICD-10-CM

## 2018-05-19 DIAGNOSIS — I513 Intracardiac thrombosis, not elsewhere classified: Secondary | ICD-10-CM | POA: Diagnosis present

## 2018-05-19 DIAGNOSIS — E1165 Type 2 diabetes mellitus with hyperglycemia: Secondary | ICD-10-CM | POA: Diagnosis present

## 2018-05-19 DIAGNOSIS — I208 Other forms of angina pectoris: Secondary | ICD-10-CM | POA: Diagnosis not present

## 2018-05-19 DIAGNOSIS — I255 Ischemic cardiomyopathy: Secondary | ICD-10-CM | POA: Diagnosis present

## 2018-05-19 DIAGNOSIS — G8929 Other chronic pain: Secondary | ICD-10-CM | POA: Diagnosis present

## 2018-05-19 DIAGNOSIS — Z880 Allergy status to penicillin: Secondary | ICD-10-CM

## 2018-05-19 DIAGNOSIS — F14129 Cocaine abuse with intoxication, unspecified: Secondary | ICD-10-CM | POA: Diagnosis present

## 2018-05-19 DIAGNOSIS — I25118 Atherosclerotic heart disease of native coronary artery with other forms of angina pectoris: Secondary | ICD-10-CM | POA: Diagnosis present

## 2018-05-19 DIAGNOSIS — Z590 Homelessness: Secondary | ICD-10-CM | POA: Diagnosis not present

## 2018-05-19 DIAGNOSIS — R011 Cardiac murmur, unspecified: Secondary | ICD-10-CM | POA: Diagnosis present

## 2018-05-19 DIAGNOSIS — Y906 Blood alcohol level of 120-199 mg/100 ml: Secondary | ICD-10-CM | POA: Diagnosis present

## 2018-05-19 DIAGNOSIS — I509 Heart failure, unspecified: Secondary | ICD-10-CM

## 2018-05-19 DIAGNOSIS — R451 Restlessness and agitation: Secondary | ICD-10-CM | POA: Diagnosis present

## 2018-05-19 DIAGNOSIS — I1310 Hypertensive heart and chronic kidney disease without heart failure, with stage 1 through stage 4 chronic kidney disease, or unspecified chronic kidney disease: Secondary | ICD-10-CM

## 2018-05-19 DIAGNOSIS — F10129 Alcohol abuse with intoxication, unspecified: Secondary | ICD-10-CM | POA: Diagnosis present

## 2018-05-19 DIAGNOSIS — Z95810 Presence of automatic (implantable) cardiac defibrillator: Secondary | ICD-10-CM

## 2018-05-19 DIAGNOSIS — I110 Hypertensive heart disease with heart failure: Secondary | ICD-10-CM | POA: Diagnosis not present

## 2018-05-19 DIAGNOSIS — M549 Dorsalgia, unspecified: Secondary | ICD-10-CM | POA: Diagnosis present

## 2018-05-19 DIAGNOSIS — Z9114 Patient's other noncompliance with medication regimen: Secondary | ICD-10-CM

## 2018-05-19 DIAGNOSIS — Z955 Presence of coronary angioplasty implant and graft: Secondary | ICD-10-CM

## 2018-05-19 LAB — CBC WITH DIFFERENTIAL/PLATELET
BASOS ABS: 0 10*3/uL (ref 0–0.1)
BASOS PCT: 1 %
Eosinophils Absolute: 0.1 10*3/uL (ref 0–0.7)
Eosinophils Relative: 1 %
HEMATOCRIT: 32.1 % — AB (ref 40.0–52.0)
HEMOGLOBIN: 11.2 g/dL — AB (ref 13.0–18.0)
Lymphocytes Relative: 44 %
Lymphs Abs: 3 10*3/uL (ref 1.0–3.6)
MCH: 34.7 pg — ABNORMAL HIGH (ref 26.0–34.0)
MCHC: 34.9 g/dL (ref 32.0–36.0)
MCV: 99.5 fL (ref 80.0–100.0)
Monocytes Absolute: 0.6 10*3/uL (ref 0.2–1.0)
Monocytes Relative: 9 %
NEUTROS ABS: 3.1 10*3/uL (ref 1.4–6.5)
NEUTROS PCT: 45 %
Platelets: 215 10*3/uL (ref 150–440)
RBC: 3.22 MIL/uL — ABNORMAL LOW (ref 4.40–5.90)
RDW: 13.4 % (ref 11.5–14.5)
WBC: 6.9 10*3/uL (ref 3.8–10.6)

## 2018-05-19 LAB — GLUCOSE, CAPILLARY
Glucose-Capillary: 222 mg/dL — ABNORMAL HIGH (ref 70–99)
Glucose-Capillary: 139 mg/dL — ABNORMAL HIGH (ref 70–99)
Glucose-Capillary: 165 mg/dL — ABNORMAL HIGH (ref 70–99)

## 2018-05-19 LAB — BLOOD GAS, VENOUS
ACID-BASE DEFICIT: 4.1 mmol/L — AB (ref 0.0–2.0)
Bicarbonate: 21.6 mmol/L (ref 20.0–28.0)
O2 SAT: 89.5 %
PCO2 VEN: 41 mmHg — AB (ref 44.0–60.0)
Patient temperature: 37
pH, Ven: 7.33 (ref 7.250–7.430)
pO2, Ven: 62 mmHg — ABNORMAL HIGH (ref 32.0–45.0)

## 2018-05-19 LAB — URINE DRUG SCREEN, QUALITATIVE (ARMC ONLY)
Amphetamines, Ur Screen: NOT DETECTED
Barbiturates, Ur Screen: NOT DETECTED
Cannabinoid 50 Ng, Ur ~~LOC~~: NOT DETECTED
Cocaine Metabolite,Ur ~~LOC~~: POSITIVE — AB
MDMA (Ecstasy)Ur Screen: NOT DETECTED
Methadone Scn, Ur: NOT DETECTED
Opiate, Ur Screen: NOT DETECTED
Phencyclidine (PCP) Ur S: NOT DETECTED
Tricyclic, Ur Screen: NOT DETECTED

## 2018-05-19 LAB — COMPREHENSIVE METABOLIC PANEL
ALT: 36 U/L (ref 0–44)
ANION GAP: 11 (ref 5–15)
AST: 42 U/L — ABNORMAL HIGH (ref 15–41)
Albumin: 3.4 g/dL — ABNORMAL LOW (ref 3.5–5.0)
Alkaline Phosphatase: 102 U/L (ref 38–126)
BUN: 13 mg/dL (ref 6–20)
CO2: 22 mmol/L (ref 22–32)
Calcium: 8.3 mg/dL — ABNORMAL LOW (ref 8.9–10.3)
Chloride: 108 mmol/L (ref 98–111)
Creatinine, Ser: 1.14 mg/dL (ref 0.61–1.24)
GFR calc non Af Amer: >60 mL/min (ref >60)
Glucose, Bld: 348 mg/dL — ABNORMAL HIGH (ref 70–99)
Potassium: 3.6 mmol/L (ref 3.5–5.1)
SODIUM: 141 mmol/L (ref 135–145)
Total Bilirubin: 0.5 mg/dL (ref 0.3–1.2)
Total Protein: 5.7 g/dL — ABNORMAL LOW (ref 6.5–8.1)

## 2018-05-19 LAB — MAGNESIUM: Magnesium: 1.7 mg/dL (ref 1.7–2.4)

## 2018-05-19 LAB — PREALBUMIN: PREALBUMIN: 21.9 mg/dL (ref 18–38)

## 2018-05-19 LAB — BRAIN NATRIURETIC PEPTIDE: B NATRIURETIC PEPTIDE 5: 534 pg/mL — AB (ref 0.0–100.0)

## 2018-05-19 LAB — TROPONIN I
TROPONIN I: 0.03 ng/mL — AB (ref <0.03)
TROPONIN I: 0.03 ng/mL — AB (ref <0.03)
Troponin I: <0.03 ng/mL (ref <0.03)

## 2018-05-19 LAB — PHOSPHORUS: Phosphorus: 3.8 mg/dL (ref 2.5–4.6)

## 2018-05-19 LAB — BETA-HYDROXYBUTYRIC ACID: BETA-HYDROXYBUTYRIC ACID: 0.25 mmol/L (ref 0.05–0.27)

## 2018-05-19 LAB — ETHANOL: Alcohol, Ethyl (B): 187 mg/dL — ABNORMAL HIGH (ref <10)

## 2018-05-19 MED ORDER — THIAMINE HCL 100 MG/ML IJ SOLN
100.0000 mg | Freq: Every day | INTRAMUSCULAR | Status: DC
  Administered 2018-05-19: 100 mg via INTRAVENOUS
  Filled 2018-05-19: qty 2

## 2018-05-19 MED ORDER — LISINOPRIL 10 MG PO TABS
5.0000 mg | ORAL_TABLET | Freq: Every day | ORAL | Status: DC
  Administered 2018-05-19 – 2018-05-20 (×2): 5 mg via ORAL
  Filled 2018-05-19 (×2): qty 1

## 2018-05-19 MED ORDER — ROSUVASTATIN CALCIUM 20 MG PO TABS
20.0000 mg | ORAL_TABLET | Freq: Every day | ORAL | Status: DC
  Administered 2018-05-19: 20 mg via ORAL
  Filled 2018-05-19: qty 1
  Filled 2018-05-19: qty 2
  Filled 2018-05-19: qty 1

## 2018-05-19 MED ORDER — DIPHENHYDRAMINE HCL 50 MG/ML IJ SOLN
50.0000 mg | Freq: Once | INTRAMUSCULAR | Status: AC
  Administered 2018-05-19: 50 mg via INTRAVENOUS
  Filled 2018-05-19: qty 1

## 2018-05-19 MED ORDER — LORAZEPAM 2 MG/ML IJ SOLN
2.0000 mg | Freq: Once | INTRAMUSCULAR | Status: AC
  Administered 2018-05-19: 2 mg via INTRAVENOUS
  Filled 2018-05-19: qty 1

## 2018-05-19 MED ORDER — LORAZEPAM 2 MG/ML IJ SOLN: 1.0000 mg | Freq: Four times a day (QID) | INTRAMUSCULAR | Status: DC | PRN

## 2018-05-19 MED ORDER — VITAMIN B-1 100 MG PO TABS: 100.0000 mg | ORAL_TABLET | Freq: Every day | ORAL | Status: DC

## 2018-05-19 MED ORDER — AMIODARONE HCL 200 MG PO TABS
400.0000 mg | ORAL_TABLET | Freq: Every day | ORAL | Status: DC
  Administered 2018-05-19 – 2018-05-20 (×2): 400 mg via ORAL
  Filled 2018-05-19 (×2): qty 2

## 2018-05-19 MED ORDER — APIXABAN 5 MG PO TABS
5.0000 mg | ORAL_TABLET | Freq: Two times a day (BID) | ORAL | Status: DC
  Administered 2018-05-19 – 2018-05-20 (×3): 5 mg via ORAL
  Filled 2018-05-19 (×3): qty 1

## 2018-05-19 MED ORDER — FUROSEMIDE 10 MG/ML IJ SOLN
20.0000 mg | Freq: Two times a day (BID) | INTRAMUSCULAR | Status: AC
  Administered 2018-05-19 (×2): 20 mg via INTRAVENOUS
  Filled 2018-05-19 (×2): qty 2

## 2018-05-19 MED ORDER — FUROSEMIDE 10 MG/ML IJ SOLN
20.0000 mg | Freq: Once | INTRAMUSCULAR | Status: AC
  Administered 2018-05-19: 20 mg via INTRAVENOUS
  Filled 2018-05-19: qty 4

## 2018-05-19 MED ORDER — LORAZEPAM 2 MG/ML IJ SOLN: 0.0000 mg | Freq: Two times a day (BID) | INTRAMUSCULAR | Status: DC

## 2018-05-19 MED ORDER — HALOPERIDOL LACTATE 5 MG/ML IJ SOLN
5.0000 mg | Freq: Once | INTRAMUSCULAR | Status: AC
  Administered 2018-05-19: 5 mg via INTRAVENOUS
  Filled 2018-05-19: qty 1

## 2018-05-19 MED ORDER — ADULT MULTIVITAMIN W/MINERALS CH
1.0000 | ORAL_TABLET | Freq: Every day | ORAL | Status: DC
  Administered 2018-05-19: 1 via ORAL
  Filled 2018-05-19: qty 1

## 2018-05-19 MED ORDER — LORAZEPAM 1 MG PO TABS
1.0000 mg | ORAL_TABLET | Freq: Four times a day (QID) | ORAL | Status: DC | PRN
  Administered 2018-05-20: 1 mg via ORAL
  Filled 2018-05-19 (×2): qty 1

## 2018-05-19 MED ORDER — CARVEDILOL 6.25 MG PO TABS
12.5000 mg | ORAL_TABLET | Freq: Two times a day (BID) | ORAL | Status: DC
  Administered 2018-05-19 – 2018-05-20 (×3): 12.5 mg via ORAL
  Filled 2018-05-19 (×3): qty 2

## 2018-05-19 MED ORDER — OXYCODONE HCL 5 MG PO TABS
5.0000 mg | ORAL_TABLET | Freq: Four times a day (QID) | ORAL | Status: DC | PRN
  Administered 2018-05-19 – 2018-05-20 (×2): 5 mg via ORAL
  Filled 2018-05-19 (×2): qty 1

## 2018-05-19 MED ORDER — FUROSEMIDE 20 MG PO TABS
20.0000 mg | ORAL_TABLET | Freq: Every day | ORAL | Status: DC
  Administered 2018-05-20: 20 mg via ORAL
  Filled 2018-05-19: qty 1

## 2018-05-19 MED ORDER — ACETAMINOPHEN 325 MG PO TABS
650.0000 mg | ORAL_TABLET | Freq: Four times a day (QID) | ORAL | Status: DC | PRN
  Administered 2018-05-20: 650 mg via ORAL
  Filled 2018-05-19: qty 2

## 2018-05-19 MED ORDER — FOLIC ACID 1 MG PO TABS
1.0000 mg | ORAL_TABLET | Freq: Every day | ORAL | Status: DC
  Administered 2018-05-19 – 2018-05-20 (×2): 1 mg via ORAL
  Filled 2018-05-19 (×2): qty 1

## 2018-05-19 MED ORDER — ASPIRIN 81 MG PO CHEW
81.0000 mg | CHEWABLE_TABLET | Freq: Every day | ORAL | Status: DC
  Administered 2018-05-19 – 2018-05-20 (×2): 81 mg via ORAL
  Filled 2018-05-19 (×2): qty 1

## 2018-05-19 MED ORDER — MAGNESIUM SULFATE 2 GM/50ML IV SOLN
2.0000 g | Freq: Once | INTRAVENOUS | Status: AC
  Administered 2018-05-19: 2 g via INTRAVENOUS
  Filled 2018-05-19: qty 50

## 2018-05-19 MED ORDER — LORAZEPAM 2 MG/ML IJ SOLN: 0.0000 mg | Freq: Four times a day (QID) | INTRAMUSCULAR | Status: DC

## 2018-05-19 MED ORDER — INSULIN ASPART 100 UNIT/ML ~~LOC~~ SOLN: 0.0000 [IU] | Freq: Every day | SUBCUTANEOUS | Status: DC

## 2018-05-19 MED ORDER — CLONAZEPAM 0.5 MG PO TABS: 0.5000 mg | ORAL_TABLET | Freq: Two times a day (BID) | ORAL | Status: DC | PRN

## 2018-05-19 MED ORDER — INSULIN ASPART 100 UNIT/ML ~~LOC~~ SOLN
0.0000 [IU] | Freq: Three times a day (TID) | SUBCUTANEOUS | Status: DC
  Administered 2018-05-19: 2 [IU] via SUBCUTANEOUS
  Administered 2018-05-19 – 2018-05-20 (×2): 5 [IU] via SUBCUTANEOUS
  Filled 2018-05-19 (×3): qty 1

## 2018-05-19 MED ORDER — INSULIN GLARGINE 100 UNIT/ML ~~LOC~~ SOLN
25.0000 [IU] | Freq: Two times a day (BID) | SUBCUTANEOUS | Status: DC
  Administered 2018-05-19 – 2018-05-20 (×3): 25 [IU] via SUBCUTANEOUS
  Filled 2018-05-19 (×4): qty 0.25

## 2018-05-19 MED ORDER — GABAPENTIN 400 MG PO CAPS
800.0000 mg | ORAL_CAPSULE | Freq: Two times a day (BID) | ORAL | Status: DC
  Administered 2018-05-19 – 2018-05-20 (×3): 800 mg via ORAL
  Filled 2018-05-19 (×3): qty 2
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
## 2018-05-19 NOTE — Progress Notes (Signed)
Talked to Dr. Nancy MarusMayo about patient's complaints of chest pain and requesting roxicodone, per MD will place order for Tylenol to try first then roxicodone for breath trough pain. Patient refusing to take tylenol, he said it doesn't work for him. Waiting for MD to place order. RN will continue to monitor.

## 2018-05-19 NOTE — Progress Notes (Signed)
[employer] - [place] at [facility]   PATIENT NAME: Mario JaegerJohnny Gallagher    MR#:  [id number]  DATE OF BIRTH:  01/19/1961  SUBJECTIVE:  No complaints today. Denies chest pain, shortness of breath, abdominal pain. States he just wants to sleep.  REVIEW OF SYSTEMS:  Review of Systems  Constitutional: Negative for chills and fever.  HENT: Negative for congestion and sore throat.   Eyes: Negative for blurred vision and double vision.  Respiratory: Negative for shortness of breath and wheezing.   Cardiovascular: Negative for chest pain and palpitations.  Gastrointestinal: Negative for nausea and vomiting.  Genitourinary: Negative for dysuria and frequency.  Musculoskeletal: Positive for back pain and neck pain.  Neurological: Negative for dizziness and headaches.    DRUG ALLERGIES:   Allergies  Allergen Reactions  . Codeine Swelling  . Penicillins Swelling  . Tramadol Swelling   VITALS:  Blood pressure (!) 105/57, pulse (!) 59, temperature 98.6 F (37 C), temperature source Oral, resp. rate 18, height 5\' 8"  (1.727 m), weight 61.1 kg, SpO2 98 %. PHYSICAL EXAMINATION:  Physical Exam  Constitutional: laying in bed, sleeping, awakens to voice, in NAD HENT: normocephalic, atraumatic. Oropharynx is clear and moist.  Eyes: Conjunctivae, EOM and lids are normal. No scleral icterus.  Neck: Neck supple. JVD present. No thyromegaly present.  Cardiovascular: RRR, S1 normal and S2 normal. Exam reveals no gallop, no S3, no S4, no distant heart sounds and no friction rub. 3/6 systolic murmur. Pulmonary/Chest: normal work of breathing, no accessory muscle use, lungs CTAB. Abdominal: Soft. He exhibits no distension. Bowel sounds are decreased. There is no tenderness. There is no rigidity, no rebound and no guarding.  Musculoskeletal: Normal range of motion. He exhibits edema. He exhibits no tenderness.  Lymphadenopathy:    He has no cervical adenopathy.  Neurological: CN 2-12  grossly intact, no focal weakness. Skin: Skin is warm and dry. No rash noted. He is not diaphoretic. No erythema.  Psychiatric: Does not answer orientation questions. LABORATORY PANEL:  Male CBC Recent Labs  Lab 05/19/18 0131  WBC 6.9  HGB 11.2*  HCT 32.1*  PLT 215   ------------------------------------------------------------------------------------------------------------------ Chemistries  Recent Labs  Lab 05/19/18 0131 05/19/18 0506  NA 141  --   K 3.6  --   CL 108  --   CO2 22  --   GLUCOSE 348*  --   BUN 13  --   CREATININE 1.14  --   CALCIUM 8.3*  --   MG  --  1.7  AST 42*  --   ALT 36  --   ALKPHOS 102  --   BILITOT 0.5  --    RADIOLOGY:  Dg Chest Port 1 View  Result Date: 05/19/2018 CLINICAL DATA:  Shortness of breath and chest pressure. EXAM: PORTABLE CHEST 1 VIEW COMPARISON:  04/24/2017 FINDINGS: Cardiac pacemaker. Shallow inspiration. Cardiac enlargement with pulmonary vascular congestion. No edema or consolidation. No blunting of costophrenic angles. No pneumothorax. Mediastinal contours appear intact. IMPRESSION: Cardiac enlargement with pulmonary vascular congestion. No edema or consolidation. Electronically Signed   By: Burman NievesWilliam  Stevens M.D.   On: 05/19/2018 02:31   ASSESSMENT AND PLAN:   Acute on chronic systolic CHF exacerbation- EF 62-13%15-20%. Crackles and LE edema have improved. - continue lasix 20mg  IV bid - strict I/O, daily weights - cardiology following- recommends low dose coreg and possibly ACE/ARB if BP tolerates it  LV apical thrombus- stable - continue eliquis  Polysubstance abuse- intoxicated on admission. UDS +  cocaine - CIWA - continue thiamine, folate, MVI  T2DM- blood sugars somewhat well-controlled. - continue lantus and SSI  Normocytic anemia- likely anemia of chronic disease. No signs of active bleeding. - monitor  QT prolongation - replete to keep mag > 2 and K > 4 - daily mag and bmp  All the records are reviewed and  case discussed with Care Management/Social Worker. Management plans discussed with the patient, family and they are in agreement.  CODE STATUS: Full code  TOTAL TIME TAKING CARE OF THIS PATIENT: 35 minutes.   More than 50% of the time was spent in counseling/coordination of care: YES  POSSIBLE D/C IN 1-2 DAYS, DEPENDING ON CLINICAL CONDITION.   Jinny BlossomKaty D Goebel Hellums M.D on 05/19/2018 at 8:55 PM  Between 7am to 6pm - Pager - [phone]  After 6pm go to [email or web address] - Social research officer, governmentpassword EPAS [facility]  [facility] [place] [facility]  Office  [phone]  CC: Primary care physician; System, Pcp Not In  Note: This dictation was prepared with Dragon dictation along with smaller phrase technology. Any transcriptional errors that result from this process are unintentional.

## 2018-05-19 NOTE — Consult Note (Addendum)
Cardiology Consultation:   Patient ID: Mario JaegerJohnny Gallagher; [id number]; 03/01/1961   Admit date: 05/19/2018 Date of Consult: 05/19/2018  Primary Care Provider: System, Pcp Not In Primary Cardiologist: [facility] Primary Electrophysiologist:     Patient Profile:   Mario JaegerJohnny Gallagher is a 57 y.o. male with a hx of chronic systolic heart failure and coronary artery disease who is being seen today for the evaluation of chest pain and shortness of breath at the request of Dr. Marjie SkiffSridharan.  History of Present Illness:   Mario Gallagher is a 57 year old male who is currently homeless with known history of alcohol and cocaine use who presented with chest pain and shortness of breath.  He has known history of chronic systolic heart failure with severely reduced LV systolic function.  [place] was followed in the past in [place] and [place] [facility] recently [facility].  He suffered myocardial infarction in 2012 with an occluded LAD which was stented.  He subsequently underwent stenting of the RCA and left circumflex according to the notes.  He was hospitalized in July at the [facility] with chest pain.  He underwent cardiac catheterization which showed chronically occluded RCA and LAD with collaterals.  Medical therapy was recommended.  He had another hospitalization recently in the same institution with chest pain.  He underwent a [employer] Myoview which showed evidence of infarct in the anterior and RCA distribution without significant ischemia.  EF was reported to be 31%.  However, by echo his EF was 15 to 20%. The patient presented with worsening chest pain and shortness of breath.  He was found to be agitated and combative upon his arrival and he had to be sedated.  He was found to have significantly elevated EtOH level and positive for cocaine.  Troponin was minimally elevated.  EKG showed atrial sensed ventricular paced rhythm. The patient currently says that the chest pain has improved  although he continues to ask for IV pain medications and he also requested that I refill his oral pain medications upon discharge.  Past Medical History:  Diagnosis Date  . Acute MI (HCC)   . CHF (congestive heart failure) (HCC)   . Diabetes mellitus without complication (HCC)   . H/O blood clots   . Hyperlipemia   . Hypertension   . Left kidney mass     Past Surgical History:  Procedure Laterality Date  . CARDIAC DEFIBRILLATOR PLACEMENT    . HERNIA REPAIR       Home Medications:  Prior to Admission medications   Not on File    Inpatient Medications: Scheduled Meds: . amiodarone  400 mg Oral Daily  . apixaban  5 mg Oral BID  . aspirin  81 mg Oral Daily  . carvedilol  12.5 mg Oral BID WC  . folic acid  1 mg Oral Daily  . furosemide  20 mg Intravenous BID   Followed by  . [START ON 05/20/2018] furosemide  20 mg Oral Daily  . gabapentin  800 mg Oral BID  . insulin aspart  0-15 Units Subcutaneous TID WC  . insulin aspart  0-5 Units Subcutaneous QHS  . insulin glargine  25 Units Subcutaneous Q12H  . lisinopril  5 mg Oral Daily  . LORazepam  0-4 mg Intravenous Q6H   Followed by  . [START ON 05/21/2018] LORazepam  0-4 mg Intravenous Q12H  . multivitamin with minerals  1 tablet Oral Daily  . rosuvastatin  20 mg Oral q1800  . thiamine  100  mg Oral Daily   Or  . thiamine  100 mg Intravenous Daily   Continuous Infusions:  PRN Meds: clonazePAM, LORazepam **OR** LORazepam  Allergies:    Allergies  Allergen Reactions  . Codeine Swelling  . Penicillins Swelling  . Tramadol Swelling    Social History:   Social History   Socioeconomic History  . Marital status: Single    Spouse name: Not on file  . Number of children: Not on file  . Years of education: Not on file  . Highest education level: Not on file  Occupational History  . Not on file  Social Needs  . Financial resource strain: Not on file  . Food insecurity:    Worry: Not on file    Inability: Not on  file  . Transportation needs:    Medical: Not on file    Non-medical: Not on file  Tobacco Use  . Smoking status: Current Every Day Smoker    Packs/day: 0.50    Types: Cigarettes  . Smokeless tobacco: Never Used  Substance and Sexual Activity  . Alcohol use: No  . Drug use: No  . Sexual activity: Not on file  Lifestyle  . Physical activity:    Days per week: Not on file    Minutes per session: Not on file  . Stress: Not on file  Relationships  . Social connections:    Talks on phone: Not on file    Gets together: Not on file    Attends religious service: Not on file    Active member of club or organization: Not on file    Attends meetings of clubs or organizations: Not on file    Relationship status: Not on file  . Intimate partner violence:    Fear of current or ex partner: Not on file    Emotionally abused: Not on file    Physically abused: Not on file    Forced sexual activity: Not on file  Other Topics Concern  . Not on file  Social History Narrative  . Not on file    Family History:   The patient does not know his family history.  ROS:  Please see the history of present illness.   All other ROS reviewed and negative.     Physical Exam/Data:   Vitals:   05/19/18 0730 05/19/18 0824 05/19/18 0828 05/19/18 1530  BP: 127/73  128/71 117/66  Pulse: 69  65 60  Resp: 16  18 18   Temp:   98.1 F (36.7 C) 98.3 F (36.8 C)  TempSrc:   Oral Oral  SpO2: 100% 99% 99% 99%  Weight:   61.1 kg   Height:   5\' 8"  (1.727 m)     Intake/Output Summary (Last 24 hours) at 05/19/2018 1722 Last data filed at 05/19/2018 1300 Gross per 24 hour  Intake 420 ml  Output 500 ml  Net -80 ml   Filed Weights   05/19/18 0129 05/19/18 0828  Weight: 63.5 kg 61.1 kg   Body mass index is 20.5 kg/m.  General:  Well nourished, well developed, in no acute distress HEENT: normal Lymph: no adenopathy Neck: no JVD Endocrine:  No thryomegaly Vascular: No carotid bruits; FA pulses 2+  bilaterally without bruits  Cardiac:  normal S1, S2; RRR; no murmur  Lungs:  clear to auscultation bilaterally, no wheezing, rhonchi or rales  Abd: soft, nontender, no hepatomegaly  Ext: no edema Musculoskeletal:  No deformities, BUE and BLE strength normal and equal Skin: warm  and dry  Neuro:  CNs 2-12 intact, no focal abnormalities noted Psych:  Normal affect   EKG:  The EKG was personally reviewed and demonstrates: Atrial sensed ventricular paced rhythm. Telemetry:  Telemetry was personally reviewed and demonstrates: No significant arrhythmia  Relevant CV Studies:   Laboratory Data:  Chemistry Recent Labs  Lab 05/19/18 0131  NA 141  K 3.6  CL 108  CO2 22  GLUCOSE 348*  BUN 13  CREATININE 1.14  CALCIUM 8.3*  GFRNONAA >60  GFRAA >60  ANIONGAP 11    Recent Labs  Lab 05/19/18 0131  PROT 5.7*  ALBUMIN 3.4*  AST 42*  ALT 36  ALKPHOS 102  BILITOT 0.5   Hematology Recent Labs  Lab 05/19/18 0131  WBC 6.9  RBC 3.22*  HGB 11.2*  HCT 32.1*  MCV 99.5  MCH 34.7*  MCHC 34.9  RDW 13.4  PLT 215   Cardiac Enzymes Recent Labs  Lab 05/19/18 0131 05/19/18 0506 05/19/18 0729  TROPONINI 0.03* <0.03 0.03*   No results for input(s): TROPIPOC in the last 168 hours.  BNP Recent Labs  Lab 05/19/18 0131  BNP 534.0*    DDimer No results for input(s): DDIMER in the last 168 hours.  Radiology/Studies:  Dg Chest Port 1 View  Result Date: 05/19/2018 CLINICAL DATA:  Shortness of breath and chest pressure. EXAM: PORTABLE CHEST 1 VIEW COMPARISON:  04/24/2017 FINDINGS: Cardiac pacemaker. Shallow inspiration. Cardiac enlargement with pulmonary vascular congestion. No edema or consolidation. No blunting of costophrenic angles. No pneumothorax. Mediastinal contours appear intact. IMPRESSION: Cardiac enlargement with pulmonary vascular congestion. No edema or consolidation. Electronically Signed   By: Burman NievesWilliam  Stevens M.D.   On: 05/19/2018 02:31    Assessment and Plan:    1. Chest pain in a patient with known history of coronary artery disease in the setting of cocaine intoxication.  The patient had recent cardiac work-up including cardiac catheterization as well as stress testing at the [facility] in July and in August.  I do not see evidence of acute coronary syndrome.  Thus, I do not recommend any further ischemic work-up.  Continue medical therapy. 2. Chronic systolic heart failure: Due to ischemic cardiomyopathy.  Recommend small dose carvedilol and if blood pressure tolerates an ACE inhibitor or ARB.  If the patient follows up in a regular basis and his blood pressure allows, Entresto can be considered. 3. Multidrug abuse and homelessness: The patient seems to be mainly asking about pain medications and I am not sure how much will get to be able to do for this patient given his poor compliance and poor social situation. 4. Apical LV thrombus: This was reported on most recent echocardiogram done at the [facility] last [facility].  Anticoagulation was recommended at that time.  However, the patient refused due to inability to follow-up.   For questions or updates, please contact [facility] Please consult [email or web address] for contact info under Cardiology/STEMI.   Signed, Lorine BearsMuhammad Brennen Camper, MD  05/19/2018 5:22 PM

## 2018-05-19 NOTE — ED Notes (Signed)
Pt is being verbally abusive and yelling down the hall. Pt yelled to secretary "bitch get me something for pain" and told the officer to "get the fuck out of the room".

## 2018-05-19 NOTE — ED Notes (Signed)
Date and time results received: 05/19/18 0221 (use smartphrase ".now" to insert current time)  Test: Troponin Critical Value: 0.03  Name of Provider Notified: Dr. Lamont Snowballifenbark  Orders Received? Or Actions Taken?:

## 2018-05-19 NOTE — Progress Notes (Signed)
Talked to Dr. Renae GlossWieting about patient's complaints of pain, order given for Roxicodone.   Dr. Nancy MarusMayo called back about patient's request, also order for Tylenol given. Patient is also in the shower, order for ok to shower given. No other concern at the moment. RN will continue to monitor.

## 2018-05-19 NOTE — H&P (Signed)
[employer] - [place] at [facility]   PATIENT NAME: Mario JaegerJohnny Fife    MR#:  [id number]  DATE OF BIRTH:  11/26/1960  DATE OF ADMISSION:  05/19/2018  PRIMARY CARE PHYSICIAN: System, Pcp Not In   REQUESTING/REFERRING PHYSICIAN: Merrily Brittleifenbark, Neil, MD  CHIEF COMPLAINT:  No chief complaint on file.   HISTORY OF PRESENT ILLNESS:  Mario Gallagher  is a 57 y.o. male with a known history of chronic systolic CHF (EF 91-47%15-20% as of 82/95/621308/11/2017 Echo, s/p [employer] AICD/PPM), LV apical thrombus (Eliquis), T2IDDM, HTN, HLD p/w 1d Hx chest pressure. Pt was agitated, combative, and at times overtly violent and physically/verbally abusive to staff while in the ED. As a result of his interference to management, he was ultimately sedated w/ Haldol + Ativan. He arouses transiently, for just long enough to remove all of his EKG leads, and then goes back to sleep. He is not able to provide Hx/ROS. Per discussion w/ ED provider and staff, pt is a homeless alcoholic and cocaine abuser [place]. Though the circumstances surrounding the admission are unclear, pt was brought in by EMS c/o chest tightness. He was noted to be intoxicated. EtOH 187. UTox (+) cocaine.  PAST MEDICAL HISTORY:   Past Medical History:  Diagnosis Date  . Acute MI (HCC)   . CHF (congestive heart failure) (HCC)   . Diabetes mellitus without complication (HCC)   . H/O blood clots   . Hyperlipemia   . Hypertension   . Left kidney mass     PAST SURGICAL HISTORY:   Past Surgical History:  Procedure Laterality Date  . CARDIAC DEFIBRILLATOR PLACEMENT    . HERNIA REPAIR      SOCIAL HISTORY:   Social History   Tobacco Use  . Smoking status: Current Every Day Smoker    Packs/day: 0.50    Types: Cigarettes  . Smokeless tobacco: Never Used  Substance Use Topics  . Alcohol use: No    FAMILY HISTORY:  History reviewed. No pertinent family history.  DRUG ALLERGIES:   Allergies  Allergen  Reactions  . Codeine Swelling  . Penicillins Swelling  . Tramadol Swelling    REVIEW OF SYSTEMS:   Review of Systems  Unable to perform ROS: Patient unresponsive  Cardiovascular: Positive for chest pain (Chest pressure).   Asleep after receiving sedation in ED. MEDICATIONS AT HOME:   Prior to Admission medications   Not on File      VITAL SIGNS:  Blood pressure 116/64, pulse 67, temperature 98.6 F (37 C), temperature source Oral, resp. rate 18, height 5\' 8"  (1.727 m), weight 63.5 kg, SpO2 97 %.  PHYSICAL EXAMINATION:  Physical Exam  Constitutional: He appears well-developed. He is sleeping. He has a sickly appearance. No distress. He is not intubated.  HENT:  Head: Atraumatic.  Mouth/Throat: Oropharynx is clear and moist. No oropharyngeal exudate.  Eyes: Conjunctivae, EOM and lids are normal. No scleral icterus.  Neck: Neck supple. JVD present. No thyromegaly present.  Cardiovascular: Regular rhythm, S1 normal and S2 normal.  No extrasystoles are present. Tachycardia present. Exam reveals no gallop, no S3, no S4, no distant heart sounds and no friction rub.  Murmur heard.  Systolic murmur is present with a grade of 3/6. Pulmonary/Chest: Effort normal. No accessory muscle usage or stridor. No apnea, no tachypnea and no bradypnea. He is not intubated. No respiratory distress. He has no decreased breath sounds. He has no wheezes. He has no  rhonchi. He has rales in the right lower field and the left lower field.  Abdominal: Soft. He exhibits no distension. Bowel sounds are decreased. There is no tenderness. There is no rigidity, no rebound and no guarding.  Musculoskeletal: Normal range of motion. He exhibits edema. He exhibits no tenderness.  Lymphadenopathy:    He has no cervical adenopathy.  Neurological:  Sedated, asleep.  Skin: Skin is warm and dry. No rash noted. He is not diaphoretic. No erythema.  Psychiatric: He is noncommunicative.  Sedated, asleep.   LABORATORY  PANEL:   CBC Recent Labs  Lab 05/19/18 0131  WBC 6.9  HGB 11.2*  HCT 32.1*  PLT 215   ------------------------------------------------------------------------------------------------------------------  Chemistries  Recent Labs  Lab 05/19/18 0131  NA 141  K 3.6  CL 108  CO2 22  GLUCOSE 348*  BUN 13  CREATININE 1.14  CALCIUM 8.3*  AST 42*  ALT 36  ALKPHOS 102  BILITOT 0.5   ------------------------------------------------------------------------------------------------------------------  Cardiac Enzymes Recent Labs  Lab 05/19/18 0131  TROPONINI 0.03*   ------------------------------------------------------------------------------------------------------------------  RADIOLOGY:  Dg Chest Port 1 View  Result Date: 05/19/2018 CLINICAL DATA:  Shortness of breath and chest pressure. EXAM: PORTABLE CHEST 1 VIEW COMPARISON:  04/24/2017 FINDINGS: Cardiac pacemaker. Shallow inspiration. Cardiac enlargement with pulmonary vascular congestion. No edema or consolidation. No blunting of costophrenic angles. No pneumothorax. Mediastinal contours appear intact. IMPRESSION: Cardiac enlargement with pulmonary vascular congestion. No edema or consolidation. Electronically Signed   By: Burman NievesWilliam  Stevens M.D.   On: 05/19/2018 02:31   IMPRESSION AND PLAN:   A/P: 22M chest tightness, acute on chronic systolic congestive heart failure exacerbation. EtOH abuse/intoxication, cocaine abuse, hyperglycemia (w/ T2IDDM), hypocalcemia, hypoalbuminemia, normocytic anemia. -Acute on chronic systolic CHF exac: EF 15-20% as of 05/02/2018 Echo ([facility]). Has [employer]i AICD/PPM. (+) JVD, leg edema. Bibasilar crackles, III/IV systolic murmur. CXR (+) cardiomegaly + pulmonary vascular congestion, (-) edema/effusion. BNP 534. Not on home diuretics. Level of outpt medication adherence unclear. Tele, continuous cardiac monitoring, pulse ox. O2. Trop-I 0.03, rpt pending. [id number]. Lasix (IV, transition to PO), I&O, daily  weight, free water restriction. C/w statin, beta blocker, ACE-I, Eliquis (apical thrombus). Would also likely benefit from Aldactone and Entresto, given severity of cardiomyopathy. -LV apical thrombus: Recent Echo report notes moderate size thrombus in the LV apex. C/w Eliquis. -EtOH abuse/intoxication: EtOH level 187 on admission. CIWA, Ativan PRN. Thiamine, folate, MVI. -Hyperglycemia/T2IDDM: SSI. C/w NPH. Hold Metformin. -Hypocalcemia: Ionized calcium. -Hypoalbuminemia: Prealbumin. -Normocytic anemia: Likely anemia of chronic disease. No evidence of acute blood loss at present time. -c/w home meds/formulary subs. -FEN/GI: Cardiac diabetic free water restricted diet. -DVT PPx: Eliquis. -Code status: Full code. -Disposition: Admission, > 2 midnights.   All the records are reviewed and case discussed with ED provider. Management plans discussed with the patient, family and they are in agreement.  CODE STATUS: Full code.  TOTAL TIME TAKING CARE OF THIS PATIENT: 75 minutes.    Barbaraann RondoPrasanna Rael Yo M.D on 05/19/2018 at 3:05 AM  Between 7am to 6pm - Pager - [phone]  After 6pm go to [email or web address] - Social research officer, governmentpassword EPAS [facility]  [facility] [place] [facility]  Office  [phone]  CC: Primary care physician; System, Pcp Not In   Note: This dictation was prepared with Dragon dictation along with smaller phrase technology. Any transcriptional errors that result from this process are unintentional.

## 2018-05-19 NOTE — ED Notes (Signed)
This RN to transport pt to [address]. Floor aware we are en route.

## 2018-05-19 NOTE — ED Notes (Signed)
Attempted to call report x1. Micah NoelLance, RN preparing another patient for procedure. Will call back in 10 mins, per RN.

## 2018-05-19 NOTE — Progress Notes (Signed)
Talked to Dr. Imogene Burnhen about patient wants to be full code. Code status updated.

## 2018-05-19 NOTE — ED Provider Notes (Signed)
[facility] Emergency Department Provider Note  ____________________________________________   First MD Initiated Contact with Patient 05/19/18 0120     (approximate)  I have reviewed the triage vital signs and the nursing notes.   HISTORY  Chief Complaint No chief complaint on file.  Level 5 exemption history limited by the patient's alcohol intoxication  HPI Mario Gallagher is a 57 y.o. male who comes to the emergency department via EMS with chest pain and shortness of breath.  The [place] and he himself notified EMS today that it was "difficult to breathe".  He said "you might as well just called [facility]".  He had an elevated blood sugar to 400 in route.  Further history is challenging to obtain as the patient has been drinking a large amount of alcohol this evening.  He does have a pacemaker and says that his cardiologist is at the [place] [facility] [place].    Past Medical History:  Diagnosis Date  . Acute MI (HCC)   . CHF (congestive heart failure) (HCC)   . Diabetes mellitus without complication (HCC)   . H/O blood clots   . Hyperlipemia   . Hypertension   . Left kidney mass     Patient Active Problem List   Diagnosis Date Noted  . Acute on chronic systolic (congestive) heart failure (HCC) 05/19/2018    Past Surgical History:  Procedure Laterality Date  . CARDIAC DEFIBRILLATOR PLACEMENT    . HERNIA REPAIR      Prior to Admission medications   Not on File    Allergies Codeine; Penicillins; and Tramadol  History reviewed. No pertinent family history.  Social History Social History   Tobacco Use  . Smoking status: Current Every Day Smoker    Packs/day: 0.50    Types: Cigarettes  . Smokeless tobacco: Never Used  Substance Use Topics  . Alcohol use: No  . Drug use: No    Review of Systems Level 5 exemption history limited by the patient's clinical  condition  ____________________________________________   PHYSICAL EXAM:  VITAL SIGNS: ED Triage Vitals  Enc Vitals Group     BP      Pulse      Resp      Temp      Temp src      SpO2      Weight      Height      Head Circumference      Peak Flow      Pain Score      Pain Loc      Pain Edu?      Excl. in GC?     Constitutional: Heavily intoxicated slurring his speech lying down on the bed flopping around intermittently falling asleep and intermittently cursing at staff Eyes: PERRL EOMI. Head: Atraumatic. Nose: No congestion/rhinnorhea. Mouth/Throat: No trismus Neck: No stridor.  Able to lie flat although with extremely large JVD Cardiovascular: Normal rate, regular rhythm. Grossly normal heart sounds.  Good peripheral circulation. Respiratory: Creased respiratory effort with crackles at bilateral bases Gastrointestinal: Soft nontender Musculoskeletal: No lower extremity edema   Neurologic: Moves all 4 Skin:  Skin is warm, dry and intact. No rash noted. Psychiatric: Heavily intoxicated   ____________________________________________   DIFFERENTIAL includes but not limited to  Alcohol intoxication, cocaine intoxication, congestive heart failure, pulmonary edema ____________________________________________   LABS (all labs ordered are listed, but only abnormal results are displayed)  Labs Reviewed  COMPREHENSIVE  METABOLIC PANEL - Abnormal; Notable for the following components:      Result Value   Glucose, Bld 348 (*)    Calcium 8.3 (*)    Total Protein 5.7 (*)    Albumin 3.4 (*)    AST 42 (*)    All other components within normal limits  ETHANOL - Abnormal; Notable for the following components:   Alcohol, Ethyl (B) 187 (*)    All other components within normal limits  TROPONIN I - Abnormal; Notable for the following components:   Troponin I 0.03 (*)    All other components within normal limits  BRAIN NATRIURETIC PEPTIDE - Abnormal; Notable for the  following components:   B Natriuretic Peptide 534.0 (*)    All other components within normal limits  CBC WITH DIFFERENTIAL/PLATELET - Abnormal; Notable for the following components:   RBC 3.22 (*)    Hemoglobin 11.2 (*)    HCT 32.1 (*)    MCH 34.7 (*)    All other components within normal limits  BLOOD GAS, VENOUS - Abnormal; Notable for the following components:   pCO2, Ven 41 (*)    pO2, Ven 62.0 (*)    Acid-base deficit 4.1 (*)    All other components within normal limits  BETA-HYDROXYBUTYRIC ACID  URINE DRUG SCREEN, QUALITATIVE ([facility] ONLY)  TROPONIN I  TROPONIN I  MAGNESIUM  PHOSPHORUS  CALCIUM, IONIZED  PREALBUMIN    Lab work reviewed by me with elevated beta natruretic peptide and elevated troponin concerning for stretch.  I appreciate his increased ethanol level as well __________________________________________  EKG  ED ECG REPORT I, Merrily BrittleNeil Zebulin Siegel, the attending physician, personally viewed and interpreted this ECG.  Date: 05/19/2018 EKG Time:  Rate: 68 Rhythm: ventricular paced rhythm QRS Axis: leftward Intervals: normal ST/T Wave abnormalities: normal Narrative Interpretation: no evidence of acute ischemia  ____________________________________________  RADIOLOGY  X-ray reviewed by me consistent with congestive heart failure ____________________________________________   PROCEDURES  Procedure(s) performed: yes  Angiocath insertion Performed by: Merrily BrittleNeil Cristyn Crossno  Consent: Verbal consent obtained. Risks and benefits: risks, benefits and alternatives were discussed Time out: Immediately prior to procedure a "time out" was called to verify the correct patient, procedure, equipment, support staff and site/side marked as required.  Preparation: Patient was prepped and draped in the usual sterile fashion.  Vein Location: right EJ  Gauge: 16 gauge twin cath  Normal blood return and flush without difficulty Patient tolerance: Patient tolerated the  procedure well with no immediate complications.     Procedures  Critical Care performed: no  ____________________________________________   INITIAL IMPRESSION / ASSESSMENT AND PLAN / ED COURSE  Pertinent labs & imaging results that were available during my care of the patient were reviewed by me and considered in my medical decision making (see chart for details).   As part of my medical decision making, I reviewed the following data within the electronic MEDICAL RECORD NUMBER History obtained from family if available, nursing notes, old chart and ekg, as well as notes from prior ED visits.  The patient arrives heavily intoxicated with obvious large JVD and pulmonary edema on exam.  EKG with no STEMI.  Haldol Ativan and Benadryl given for the patient's pain and agitation and broad labs are pending.  The patient is now calm and cooperative.  His labs are concerning for acute pulmonary edema with acute kidney injury as his creatinine has gone up by more than 50%.  Have begun diuresis with furosemide but at this point the  patient requires inpatient admission.  Hospitalist understands and agrees with the plan.      ____________________________________________   FINAL CLINICAL IMPRESSION(S) / ED DIAGNOSES  Final diagnoses:  Acute congestive heart failure, unspecified heart failure type (HCC)  Acute kidney injury (HCC)  Cardiorenal disease      NEW MEDICATIONS STARTED DURING THIS VISIT:  New Prescriptions   No medications on file     Note:  This document was prepared using Dragon voice recognition software and may include unintentional dictation errors.     Merrily Brittleifenbark, Saivon Prowse, MD 05/19/18 [phone]

## 2018-05-19 NOTE — ED Notes (Signed)
Made pt aware that I needed a urine sample, pt continues to get up out of bed and urinate in sink and on floor without making this RN aware that he needs to use the bathroom.

## 2018-05-19 NOTE — ED Triage Notes (Signed)
Pt arrived via EMS with complaints of chest pressure. According to EMS pt is homeless and was sleeping behind a group home in the woods. Pt has a pacemaker. EMS reported that his breath smelled fruity. Ems stated that pt is possibly drunk. EMS placed an 18 gauge in left forearm. EMS gave 250 ml of NaCl. VS per EMS BP-107/54 HR-78 O2sat-100%RA. Pt alert and oriented x 4. Pt mumbles when speaking.

## 2018-05-19 NOTE — ED Notes (Signed)
2nd troponin consistent with first value, per lab.

## 2018-05-20 DIAGNOSIS — I208 Other forms of angina pectoris: Secondary | ICD-10-CM

## 2018-05-20 LAB — BASIC METABOLIC PANEL
Anion gap: 7 (ref 5–15)
BUN: 26 mg/dL — AB (ref 6–20)
CALCIUM: 8.9 mg/dL (ref 8.9–10.3)
CHLORIDE: 100 mmol/L (ref 98–111)
CO2: 31 mmol/L (ref 22–32)
CREATININE: 0.78 mg/dL (ref 0.61–1.24)
GFR calc non Af Amer: >60 mL/min (ref >60)
Glucose, Bld: 123 mg/dL — ABNORMAL HIGH (ref 70–99)
Potassium: 3.2 mmol/L — ABNORMAL LOW (ref 3.5–5.1)
SODIUM: 138 mmol/L (ref 135–145)

## 2018-05-20 LAB — CBC
HEMATOCRIT: 38.4 % — AB (ref 40.0–52.0)
Hemoglobin: 13.4 g/dL (ref 13.0–18.0)
MCH: 34 pg (ref 26.0–34.0)
MCHC: 34.9 g/dL (ref 32.0–36.0)
MCV: 97.6 fL (ref 80.0–100.0)
PLATELETS: 225 10*3/uL (ref 150–440)
RBC: 3.94 MIL/uL — ABNORMAL LOW (ref 4.40–5.90)
RDW: 13.3 % (ref 11.5–14.5)
WBC: 8.3 10*3/uL (ref 3.8–10.6)

## 2018-05-20 LAB — CALCIUM, IONIZED: Calcium, Ionized, Serum: 4.6 mg/dL (ref 4.5–5.6)

## 2018-05-20 LAB — GLUCOSE, CAPILLARY: Glucose-Capillary: 222 mg/dL — ABNORMAL HIGH (ref 70–99)

## 2018-05-20 LAB — MAGNESIUM: Magnesium: 2.3 mg/dL (ref 1.7–2.4)

## 2018-05-20 MED ORDER — ROSUVASTATIN CALCIUM 20 MG PO TABS: 20.0000 mg | ORAL_TABLET | Freq: Every day | ORAL | 0 refills | Status: AC

## 2018-05-20 MED ORDER — APIXABAN 5 MG PO TABS: 5.0000 mg | ORAL_TABLET | Freq: Two times a day (BID) | ORAL | 0 refills | Status: DC

## 2018-05-20 MED ORDER — METFORMIN HCL 500 MG PO TABS: 500.0000 mg | ORAL_TABLET | Freq: Two times a day (BID) | ORAL | 0 refills | Status: DC

## 2018-05-20 MED ORDER — ISOSORBIDE MONONITRATE ER 30 MG PO TB24: 15.0000 mg | ORAL_TABLET | Freq: Every day | ORAL | 0 refills | Status: AC

## 2018-05-20 MED ORDER — POTASSIUM CHLORIDE CRYS ER 20 MEQ PO TBCR: 40.0000 meq | EXTENDED_RELEASE_TABLET | Freq: Two times a day (BID) | ORAL | Status: DC

## 2018-05-20 MED ORDER — LISINOPRIL 5 MG PO TABS: 5.0000 mg | ORAL_TABLET | Freq: Every day | ORAL | 0 refills | Status: AC

## 2018-05-20 MED ORDER — FUROSEMIDE 20 MG PO TABS: 20.0000 mg | ORAL_TABLET | Freq: Every day | ORAL | 0 refills | Status: AC

## 2018-05-20 MED ORDER — AMIODARONE HCL 400 MG PO TABS: 400.0000 mg | ORAL_TABLET | Freq: Every day | ORAL | 0 refills | Status: AC

## 2018-05-20 MED ORDER — ISOSORBIDE MONONITRATE ER 30 MG PO TB24
15.0000 mg | ORAL_TABLET | Freq: Every day | ORAL | Status: DC
  Administered 2018-05-20: 15 mg via ORAL
  Filled 2018-05-20: qty 1

## 2018-05-20 MED ORDER — ASPIRIN 81 MG PO CHEW: 81.0000 mg | CHEWABLE_TABLET | Freq: Every day | ORAL | 0 refills | Status: AC

## 2018-05-20 MED ORDER — OXYCODONE HCL 5 MG PO TABS: 5.0000 mg | ORAL_TABLET | Freq: Four times a day (QID) | ORAL | 0 refills | Status: DC | PRN

## 2018-05-20 MED ORDER — GABAPENTIN 400 MG PO CAPS: 800.0000 mg | ORAL_CAPSULE | Freq: Two times a day (BID) | ORAL | 0 refills | Status: DC

## 2018-05-20 MED ORDER — CARVEDILOL 12.5 MG PO TABS: 12.5000 mg | ORAL_TABLET | Freq: Two times a day (BID) | ORAL | 0 refills | Status: DC

## 2018-05-20 MED ORDER — SPIRONOLACTONE 25 MG PO TABS
12.5000 mg | ORAL_TABLET | Freq: Every day | ORAL | Status: DC
  Filled 2018-05-20: qty 0.5

## 2018-05-20 MED ORDER — SPIRONOLACTONE 25 MG PO TABS: 12.5000 mg | ORAL_TABLET | Freq: Every day | ORAL | 0 refills | Status: AC

## 2018-05-20 MED ORDER — POTASSIUM CHLORIDE CRYS ER 20 MEQ PO TBCR
40.0000 meq | EXTENDED_RELEASE_TABLET | Freq: Once | ORAL | Status: AC
  Administered 2018-05-20: 40 meq via ORAL
  Filled 2018-05-20: qty 2

## 2018-05-20 NOTE — Progress Notes (Addendum)
Progress Note  Patient Name: Mario Gallagher Date of Encounter: 05/20/2018  [facility] Cardi[facility]: [facility]  Subjective   SOB is improved. Notes mild, constant chest pain that is unchanged from his prior 2 [facility] admissions. Of note, patient was resting comfortably upon entering his room. He indicates he would like to migrate back [place] by the first of the month. Weight is 3 kg less than his discharge [facility] weight. Potassium low at 3.2.   Inpatient Medications    Scheduled Meds: . amiodarone  400 mg Oral Daily  . apixaban  5 mg Oral BID  . aspirin  81 mg Oral Daily  . carvedilol  12.5 mg Oral BID WC  . folic acid  1 mg Oral Daily  . furosemide  20 mg Oral Daily  . gabapentin  800 mg Oral BID  . insulin aspart  0-15 Units Subcutaneous TID WC  . insulin aspart  0-5 Units Subcutaneous QHS  . insulin glargine  25 Units Subcutaneous Q12H  . isosorbide mononitrate  15 mg Oral Daily  . lisinopril  5 mg Oral Daily  . LORazepam  0-4 mg Intravenous Q6H   Followed by  . [START ON 05/21/2018] LORazepam  0-4 mg Intravenous Q12H  . multivitamin with minerals  1 tablet Oral Daily  . potassium chloride  40 mEq Oral BID  . rosuvastatin  20 mg Oral q1800  . thiamine  100 mg Oral Daily   Or  . thiamine  100 mg Intravenous Daily   Continuous Infusions:  PRN Meds: acetaminophen, clonazePAM, LORazepam **OR** LORazepam, oxyCODONE   Vital Signs    Vitals:   05/20/18 0335 05/20/18 0443 05/20/18 0445 05/20/18 0755  BP: (!) 145/72 119/63  111/88  Pulse: 68 (!) 59  60  Resp:  18  14  Temp:  97.7 F (36.5 C)  (!) 97.5 F (36.4 C)  TempSrc:  Oral    SpO2:  100%  97%  Weight:   62.6 kg   Height:        Intake/Output Summary (Last 24 hours) at 05/20/2018 0802 Last data filed at 05/20/2018 0300 Gross per 24 hour  Intake 420 ml  Output 1300 ml  Net -880 ml   Filed Weights   05/19/18 0129 05/19/18 0828 05/20/18 0445  Weight: 63.5 kg 61.1 kg 62.6 kg    Telemetry    paced - Personally  Reviewed  ECG    n/a - Personally Reviewed  Physical Exam   GEN: No acute distress.   Neck: No JVD. Cardiac: RRR, no murmurs, rubs, or gallops.  Respiratory: Clear to auscultation bilaterally.  GI: Soft, nontender, non-distended.   MS: No edema; No deformity. Neuro:  Alert and oriented x 3; Nonfocal.  Psych: Normal affect.  Labs    Chemistry Recent Labs  Lab 05/19/18 0131 05/20/18 0309  NA 141 138  K 3.6 3.2*  CL 108 100  CO2 22 31  GLUCOSE 348* 123*  BUN 13 26*  CREATININE 1.14 0.78  CALCIUM 8.3* 8.9  PROT 5.7*  --   ALBUMIN 3.4*  --   AST 42*  --   ALT 36  --   ALKPHOS 102  --   BILITOT 0.5  --   GFRNONAA >60 >60  GFRAA >60 >60  ANIONGAP 11 7     Hematology Recent Labs  Lab 05/19/18 0131 05/20/18 0309  WBC 6.9 8.3  RBC 3.22* 3.94*  HGB 11.2* 13.4  HCT 32.1* 38.4*  MCV 99.5 97.6  MCH 34.7* 34.0  MCHC 34.9 34.9  RDW 13.4 13.3  PLT 215 225    Cardiac Enzymes Recent Labs  Lab 05/19/18 0131 05/19/18 0506 05/19/18 0729  TROPONINI 0.03* <0.03 0.03*   No results for input(s): TROPIPOC in the last 168 hours.   BNP Recent Labs  Lab 05/19/18 0131  BNP 534.0*     DDimer No results for input(s): DDIMER in the last 168 hours.   Radiology    Dg Chest Port 1 View  Result Date: 05/19/2018 IMPRESSION: Cardiac enlargement with pulmonary vascular congestion. No edema or consolidation. Electronically Signed   By: Burman NievesWilliam  Stevens M.D.   On: 05/19/2018 02:31    Cardiac Studies   Echo 05/2018: Left Ventricle: Normal wall thickness. Cavity is moderately dilated.  Ejection fraction is 15 - 20%. Left ventricular systolic segmental  function is severely decreased. Diastolic function evaluation is  inconclusive. Inconclusive left atrial pressure. Moderate size thrombus  present. The thrombus is located in the apex.  Right Ventricle: Normal cavity size and ejection fraction.  There is mild mitral regurgitation.  LHC 03/2018: Coronary  Angiography Coronary angiography showed the patient to have severe 3-vessel coronary artery disease  Comments on coronary anatomy: The LAD and RCA are chronic total occlusions.  Patient Profile     57 y.o. 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 [place].   Assessment & Plan    1. CAD involving the native coronary arteries with stable angina: -[facility] -Add Imdur 15 mg daily -No plans for inpatient ischemic evaluation at this time -Continue Coreg for antianginal effect -Continue ASA  2. HFrEF secondary to ICM: -He does not appear grossly volume up -Continue Coreg, lisinopril, and Lasix -Add spironolactone 12.5 mg daily -CHF education  3. History of VT/VF: -Status post ICD -Amiodarone -Coreg -Magnesium at goal -Replete potassium to goal of > 4.0   4. Mural thrombus: -Eliquis -Will need follow up echo as an outpatient  5. Homelessness: -Recommend social worker consult to establish a long term plan to assist with decreasing recurrent admissions  6. Polysubstance abuse: -Cessation is advised  For questions or updates, please contact [facility] Please consult [email or web address] for contact info under Cardiology/STEMI.    Signed, Eula Listenyan Jaxston Chohan, PA-C [facility] Pager: [phone] 05/20/2018, 8:02 AM

## 2018-05-20 NOTE — Care Management (Signed)
There is CM consult present for medication needs.  Patient is from [place].  His girlfriend will transport patient home to [place] today.  Patient will have his meds filled then.  He has out of state medicaid which has medication coverage

## 2018-05-20 NOTE — Discharge Summary (Signed)
[employer] [facility] [place] at [facility]   PATIENT NAME: Mario Gallagher    MR#:  [id number]  DATE OF BIRTH:  [birth year]  DATE OF ADMISSION:  05/19/2018   ADMITTING PHYSICIAN: Barbaraann Rondo, MD  DATE OF DISCHARGE: 05/20/18  PRIMARY CARE PHYSICIAN: System, Pcp Not In   ADMISSION DIAGNOSIS:  Cardiorenal disease [I13.10] Acute kidney injury (HCC) [N17.9] Acute congestive heart failure, unspecified heart failure type (HCC) [I50.9] DISCHARGE DIAGNOSIS:  Active Problems:   Acute on chronic systolic (congestive) heart failure (HCC)  SECONDARY DIAGNOSIS:   Past Medical History:  Diagnosis Date  . Acute MI (HCC)   . CHF (congestive heart failure) (HCC)   . Diabetes mellitus without complication (HCC)   . H/O blood clots   . Hyperlipemia   . Hypertension   . Left kidney mass    HOSPITAL COURSE:   Mario Gallagher is a 57 year old male with a PMH of chronic systolic CHF, hx MI, T2DM, HTN, HLD who presented to the ED with acute alcohol intoxication. He was agitated and required sedation with Haldol and Ativan in the ED. There was also some concern of possible chest tightness. He was admitted for further management.  Acute alcohol/cocaine intoxication- UDS + cocaine and EtOH level was 187. - CIWA scores were 0, 3, 0 on the day of discharge - patient stated that he was not interested in quitting alcohol use and that he would return to drinking on discharge, so benzo taper was not prescribed on discharge - seen by CSW and given resources  Acute on chronic systolic CHF exacerbation- felt to have mild exacerbation on admission due to bibasilar crackles and mild LE edema, which markedly improved after IV diuresis. Last echo with EF 15-20%.  - initially treated with lasix 20mg  iv bid, then transitioned to lasix 20mg  po daily - seen by cardiology who started coreg, lisinopril, aspirin, and spironolactone. Also started imdur for chest pain  LV apical thrombus- stable - continued on  eliquis  T2DM- blood sugars somewhat well-controlled. - treated with lantus and SSI here - discharged on metformin. Did not prescribe insulin on discharge because patient is homeless and would not have anywhere to store it.  Normocytic anemia- likely anemia of chronic disease. No signs of active bleeding.  QT prolongation - mag and K repleted as needed  DISCHARGE CONDITIONS:  Chronic polysubstance abuse Acute on chronic systolic CHF exacerbation- improved LV apical thrombus T2DM Normocytic anemia QT prolongation Chronic back pain CONSULTS OBTAINED:  Treatment Team:  Barbaraann Rondo, MD Iran Ouch, MD DRUG ALLERGIES:   Allergies  Allergen Reactions  . Codeine Swelling  . Penicillins Swelling  . Tramadol Swelling   DISCHARGE MEDICATIONS:   Allergies as of 05/20/2018      Reactions   Codeine Swelling   Penicillins Swelling   Tramadol Swelling      Medication List    TAKE these medications   amiodarone 400 MG tablet Commonly known as:  PACERONE Take 1 tablet (400 mg total) by mouth daily. Start taking on:  05/21/2018   apixaban 5 MG Tabs tablet Commonly known as:  ELIQUIS Take 1 tablet (5 mg total) by mouth 2 (two) times daily.   aspirin 81 MG chewable tablet Chew 1 tablet (81 mg total) by mouth daily. Start taking on:  05/21/2018   carvedilol 12.5 MG tablet Commonly known as:  COREG Take 1 tablet (12.5 mg total) by mouth 2 (two) times daily with a meal.   furosemide 20 MG  tablet Commonly known as:  LASIX Take 1 tablet (20 mg total) by mouth daily. Start taking on:  05/21/2018   gabapentin 400 MG capsule Commonly known as:  NEURONTIN Take 2 capsules (800 mg total) by mouth 2 (two) times daily.   isosorbide mononitrate 30 MG 24 hr tablet Commonly known as:  IMDUR Take 0.5 tablets (15 mg total) by mouth daily. Start taking on:  05/21/2018   lisinopril 5 MG tablet Commonly known as:  PRINIVIL,ZESTRIL Take 1 tablet (5 mg total) by mouth  daily. Start taking on:  05/21/2018   metFORMIN 500 MG tablet Commonly known as:  GLUCOPHAGE Take 1 tablet (500 mg total) by mouth 2 (two) times daily with a meal.   oxyCODONE 5 MG immediate release tablet Commonly known as:  Oxy IR/ROXICODONE Take 1 tablet (5 mg total) by mouth every 6 (six) hours as needed for moderate pain or severe pain.   rosuvastatin 20 MG tablet Commonly known as:  CRESTOR Take 1 tablet (20 mg total) by mouth daily at 6 PM.   spironolactone 25 MG tablet Commonly known as:  ALDACTONE Take 0.5 tablets (12.5 mg total) by mouth daily.        DISCHARGE INSTRUCTIONS:  1. F/u with cardiology in 1 week 2. Needs repeat ECHO as an outpatient 3. Patient would benefit from continued substance abuse counseling DIET:  Cardiac diet DISCHARGE CONDITION:  Stable ACTIVITY:  Activity as tolerated OXYGEN:  Home Oxygen: No.  Oxygen Delivery: room air DISCHARGE LOCATION:  home   If you experience worsening of your admission symptoms, develop shortness of breath, life threatening emergency, suicidal or homicidal thoughts you must seek medical attention immediately by calling 911 or calling your MD immediately  if symptoms less severe.  You Must read complete instructions/literature along with all the possible adverse reactions/side effects for all the Medicines you take and that have been prescribed to you. Take any new Medicines after you have completely understood and accpet all the possible adverse reactions/side effects.   Please note  You were cared for by a hospitalist during your hospital stay. If you have any questions about your discharge medications or the care you received while you were in the hospital after you are discharged, you can call the unit and asked to speak with the hospitalist on call if the hospitalist that took care of you is not available. Once you are discharged, your primary care physician will handle any further medical issues. Please note  that NO REFILLS for any discharge medications will be authorized once you are discharged, as it is imperative that you return to your primary care physician (or establish a relationship with a primary care physician if you do not have one) for your aftercare needs so that they can reassess your need for medications and monitor your lab values.    On the day of Discharge:  VITAL SIGNS:  Blood pressure 111/88, pulse 60, temperature (!) 97.5 F (36.4 C), resp. rate 14, height 5\' 8"  (1.727 m), weight 62.6 kg, SpO2 97 %. PHYSICAL EXAMINATION:  GENERAL:  57 y.o.-year-old patient lying in the bed with no acute distress.  EYES: Pupils equal, round, reactive to light and accommodation. No scleral icterus. Extraocular muscles intact.  HEENT: Head atraumatic, normocephalic. Oropharynx and nasopharynx clear.  NECK:  Supple, no jugular venous distention. No thyroid enlargement, no tenderness.  LUNGS: Normal breath sounds bilaterally, no wheezing, rales,rhonchi or crepitation. No use of accessory muscles of respiration.  CARDIOVASCULAR: S1, S2 normal. No  murmurs, rubs, or gallops.  ABDOMEN: Soft, non-tender, non-distended. Bowel sounds present. No organomegaly or mass.  EXTREMITIES: No pedal edema, cyanosis, or clubbing.  NEUROLOGIC: Cranial nerves II through XII are intact. Muscle strength 5/5 in all extremities. Sensation intact. Gait not checked.  PSYCHIATRIC: The patient is alert and oriented x 3.  SKIN: No obvious rash, lesion, or ulcer.  DATA REVIEW:   CBC Recent Labs  Lab 05/20/18 0309  WBC 8.3  HGB 13.4  HCT 38.4*  PLT 225    Chemistries  Recent Labs  Lab 05/19/18 0131  05/20/18 0309  NA 141  --  138  K 3.6  --  3.2*  CL 108  --  100  CO2 22  --  31  GLUCOSE 348*  --  123*  BUN 13  --  26*  CREATININE 1.14  --  0.78  CALCIUM 8.3*  --  8.9  MG  --    < > 2.3  AST 42*  --   --   ALT 36  --   --   ALKPHOS 102  --   --   BILITOT 0.5  --   --    < > = values in this interval  not displayed.     Microbiology Results  No results found for this or any previous visit.  RADIOLOGY:  No results found.   Management plans discussed with the patient, family and they are in agreement.  CODE STATUS: Full Code   TOTAL TIME TAKING CARE OF THIS PATIENT: 40 minutes.    Jinny BlossomKaty D Destan Franchini M.D on 05/20/2018 at 9:17 AM  Between 7am to 6pm - Pager - [phone]  After 6pm go to [email or web address] - Social research officer, governmentpassword EPAS [facility]  [employer] [facility]  Office  [phone]  CC: Primary care physician; System, Pcp Not In   Note: This dictation was prepared with Dragon dictation along with smaller phrase technology. Any transcriptional errors that result from this process are unintentional.

## 2018-05-20 NOTE — Discharge Instructions (Signed)
It was so nice to meet you!  You came into the hospital for chest pain. This may have been due to your back pain or to the cocaine use, which puts extra stress on your heart.  We have started a lot of new medications for you: 1. For your chest pain: - take imdur 15mg  daily 2. For your heart failure: - take coreg twice daily - take lisinopril once daily - take lasix once daily - take spironolactone once daily - take aspirin once daily 3. For your heart rate - take amiodarone once daily 4. For the blood clot in your heart - take eliquis twice daily  Please make sure you follow-up with the heart doctor. You have an appointment scheduled on 8/27.  -Dr. Nancy MarusMayo

## 2018-05-20 NOTE — Clinical Social Work Note (Signed)
CSW consulted for homeless issues. CSW met with patient and offered resources for shelter, food, etc. Patient states that he is not interested and did not want any resources. CSW left resources for patient even though he was not interested. CSW signing off. Please re consult if further issues arise.   [place], [place]

## 2018-05-20 NOTE — Progress Notes (Signed)
Discharge instructions reviewed with pt. Prescriptions given. Pt verbalizes that he is able to have prescriptions filled at his pharmacy. Pt states that he is being discharged home with his girlfriend.

## 2018-05-20 NOTE — Progress Notes (Signed)
Discharge instructions given. Pt verbalizes understanding. Pt states that he is going home with girlfriend.

## 2018-05-26 NOTE — Progress Notes (Deleted)
   Patient ID: Carloyn JaegerJohnny Carmack, male    DOB: 07/05/1961, 57 y.o.   MRN: [id number]  HPI  Mr Shon BatonBrooks is a 57 y/o male with a history of  Echo report from 05/02/18 reviewed and showed an EF of 15-20% along with mild MR and apical thrombus.   Admitted 05/19/18 due to acute on chronic HF along with alcohol/cocaine use. Cardiology consult obtained. IV lasix initially given and then transitioned to oral diuretics. Discharged the following day. Admitted 05/02/18 due to chest pain. Had used cocaine prior to admission. Medications were adjusted and he was discharged after 6 days.   He presents today for his initial visit with a chief complaint of    Review of Systems    Physical Exam    Assessment & Plan:  1: Chronic heart failure with reduced ejection fraction- - NYHA class - BNP 05/19/18 was 534.0  2: HTN- - BMP 05/20/18 reviewed and showed sodium 138, potassium 3.2, creatinine 0.78 and GFR >60  3: DM-  4: Substance use- - tested positive for alcohol and cocaine during most recent hospitalization

## 2018-05-27 ENCOUNTER — Telehealth: Payer: Self-pay | Admitting: Family

## 2018-05-27 ENCOUNTER — Ambulatory Visit: Payer: Medicaid - Out of State | Admitting: Family

## 2018-05-27 NOTE — Telephone Encounter (Signed)
Patient did not show for his [facility] appointment on 05/27/18. Will attempt to reschedule.  

## 2018-06-19 ENCOUNTER — Inpatient Hospital Stay
Admission: EM | Admit: 2018-06-19 | Discharge: 2018-06-20 | DRG: 309 | Disposition: A | Discharge status: Home / Self Care | Payer: Medicaid - Out of State | Attending: Internal Medicine | Admitting: Internal Medicine

## 2018-06-19 ENCOUNTER — Other Ambulatory Visit: Payer: Self-pay

## 2018-06-19 ENCOUNTER — Emergency Department: Payer: Medicaid - Out of State

## 2018-06-19 DIAGNOSIS — I499 Cardiac arrhythmia, unspecified: Secondary | ICD-10-CM

## 2018-06-19 DIAGNOSIS — Z955 Presence of coronary angioplasty implant and graft: Secondary | ICD-10-CM

## 2018-06-19 DIAGNOSIS — T447X5A Adverse effect of beta-adrenoreceptor antagonists, initial encounter: Secondary | ICD-10-CM | POA: Diagnosis not present

## 2018-06-19 DIAGNOSIS — G8929 Other chronic pain: Secondary | ICD-10-CM | POA: Diagnosis present

## 2018-06-19 DIAGNOSIS — Z91199 Patient's noncompliance with other medical treatment and regimen due to unspecified reason: Secondary | ICD-10-CM | POA: Diagnosis present

## 2018-06-19 DIAGNOSIS — I214 Non-ST elevation (NSTEMI) myocardial infarction: Secondary | ICD-10-CM | POA: Diagnosis not present

## 2018-06-19 DIAGNOSIS — Z79891 Long term (current) use of opiate analgesic: Secondary | ICD-10-CM | POA: Diagnosis not present

## 2018-06-19 DIAGNOSIS — I255 Ischemic cardiomyopathy: Secondary | ICD-10-CM | POA: Diagnosis present

## 2018-06-19 DIAGNOSIS — Z79899 Other long term (current) drug therapy: Secondary | ICD-10-CM

## 2018-06-19 DIAGNOSIS — Z95810 Presence of automatic (implantable) cardiac defibrillator: Secondary | ICD-10-CM

## 2018-06-19 DIAGNOSIS — I110 Hypertensive heart disease with heart failure: Secondary | ICD-10-CM | POA: Diagnosis present

## 2018-06-19 DIAGNOSIS — I513 Intracardiac thrombosis, not elsewhere classified: Secondary | ICD-10-CM | POA: Diagnosis present

## 2018-06-19 DIAGNOSIS — Z590 Homelessness: Secondary | ICD-10-CM

## 2018-06-19 DIAGNOSIS — E785 Hyperlipidemia, unspecified: Secondary | ICD-10-CM | POA: Diagnosis present

## 2018-06-19 DIAGNOSIS — F17210 Nicotine dependence, cigarettes, uncomplicated: Secondary | ICD-10-CM | POA: Diagnosis present

## 2018-06-19 DIAGNOSIS — D72829 Elevated white blood cell count, unspecified: Secondary | ICD-10-CM | POA: Diagnosis present

## 2018-06-19 DIAGNOSIS — I959 Hypotension, unspecified: Secondary | ICD-10-CM | POA: Diagnosis not present

## 2018-06-19 DIAGNOSIS — I4901 Ventricular fibrillation: Secondary | ICD-10-CM | POA: Diagnosis present

## 2018-06-19 DIAGNOSIS — Z880 Allergy status to penicillin: Secondary | ICD-10-CM | POA: Diagnosis not present

## 2018-06-19 DIAGNOSIS — Y92230 Patient room in hospital as the place of occurrence of the external cause: Secondary | ICD-10-CM | POA: Diagnosis not present

## 2018-06-19 DIAGNOSIS — I252 Old myocardial infarction: Secondary | ICD-10-CM | POA: Diagnosis not present

## 2018-06-19 DIAGNOSIS — I2510 Atherosclerotic heart disease of native coronary artery without angina pectoris: Secondary | ICD-10-CM | POA: Diagnosis present

## 2018-06-19 DIAGNOSIS — Z9119 Patient's noncompliance with other medical treatment and regimen: Secondary | ICD-10-CM | POA: Diagnosis present

## 2018-06-19 DIAGNOSIS — R7989 Other specified abnormal findings of blood chemistry: Secondary | ICD-10-CM | POA: Diagnosis present

## 2018-06-19 DIAGNOSIS — E1165 Type 2 diabetes mellitus with hyperglycemia: Secondary | ICD-10-CM | POA: Diagnosis present

## 2018-06-19 DIAGNOSIS — I25110 Atherosclerotic heart disease of native coronary artery with unstable angina pectoris: Secondary | ICD-10-CM

## 2018-06-19 DIAGNOSIS — Z885 Allergy status to narcotic agent status: Secondary | ICD-10-CM | POA: Diagnosis not present

## 2018-06-19 DIAGNOSIS — F1410 Cocaine abuse, uncomplicated: Secondary | ICD-10-CM | POA: Diagnosis not present

## 2018-06-19 DIAGNOSIS — J449 Chronic obstructive pulmonary disease, unspecified: Secondary | ICD-10-CM | POA: Diagnosis present

## 2018-06-19 DIAGNOSIS — Z765 Malingerer [conscious simulation]: Secondary | ICD-10-CM

## 2018-06-19 DIAGNOSIS — Z9114 Patient's other noncompliance with medication regimen: Secondary | ICD-10-CM | POA: Diagnosis not present

## 2018-06-19 DIAGNOSIS — Z8349 Family history of other endocrine, nutritional and metabolic diseases: Secondary | ICD-10-CM

## 2018-06-19 DIAGNOSIS — Z7982 Long term (current) use of aspirin: Secondary | ICD-10-CM | POA: Diagnosis not present

## 2018-06-19 DIAGNOSIS — Z7901 Long term (current) use of anticoagulants: Secondary | ICD-10-CM

## 2018-06-19 DIAGNOSIS — Z8249 Family history of ischemic heart disease and other diseases of the circulatory system: Secondary | ICD-10-CM

## 2018-06-19 DIAGNOSIS — Z7984 Long term (current) use of oral hypoglycemic drugs: Secondary | ICD-10-CM

## 2018-06-19 DIAGNOSIS — I5022 Chronic systolic (congestive) heart failure: Secondary | ICD-10-CM | POA: Diagnosis present

## 2018-06-19 DIAGNOSIS — R011 Cardiac murmur, unspecified: Secondary | ICD-10-CM | POA: Diagnosis present

## 2018-06-19 LAB — TROPONIN I
TROPONIN I: 27.96 ng/mL — AB (ref <0.03)
Troponin I: 34 ng/mL (ref <0.03)
Troponin I: 31.29 ng/mL (ref <0.03)

## 2018-06-19 LAB — ETHANOL

## 2018-06-19 LAB — BASIC METABOLIC PANEL
Anion gap: 14 (ref 5–15)
BUN: 18 mg/dL (ref 6–20)
CHLORIDE: 99 mmol/L (ref 98–111)
CO2: 24 mmol/L (ref 22–32)
Calcium: 9.4 mg/dL (ref 8.9–10.3)
Creatinine, Ser: 0.85 mg/dL (ref 0.61–1.24)
GFR calc non Af Amer: >60 mL/min (ref >60)
Glucose, Bld: 252 mg/dL — ABNORMAL HIGH (ref 70–99)
POTASSIUM: 4.3 mmol/L (ref 3.5–5.1)
SODIUM: 137 mmol/L (ref 135–145)

## 2018-06-19 LAB — CBC WITH DIFFERENTIAL/PLATELET
Basophils Absolute: 0.1 10*3/uL (ref 0–0.1)
Basophils Relative: 0 %
EOS ABS: 0 10*3/uL (ref 0–0.7)
EOS PCT: 0 %
HCT: 50.1 % (ref 40.0–52.0)
HEMOGLOBIN: 17 g/dL (ref 13.0–18.0)
LYMPHS ABS: 1.1 10*3/uL (ref 1.0–3.6)
Lymphocytes Relative: 6 %
MCH: 34.2 pg — ABNORMAL HIGH (ref 26.0–34.0)
MCHC: 34 g/dL (ref 32.0–36.0)
MCV: 100.5 fL — ABNORMAL HIGH (ref 80.0–100.0)
MONO ABS: 1.1 10*3/uL — AB (ref 0.2–1.0)
Monocytes Relative: 6 %
Neutro Abs: 16.9 10*3/uL — ABNORMAL HIGH (ref 1.4–6.5)
Neutrophils Relative %: 88 %
Platelets: 211 10*3/uL (ref 150–440)
RBC: 4.98 MIL/uL (ref 4.40–5.90)
RDW: 14.5 % (ref 11.5–14.5)
WBC: 19.1 10*3/uL — ABNORMAL HIGH (ref 3.8–10.6)

## 2018-06-19 LAB — URINALYSIS, COMPLETE (UACMP) WITH MICROSCOPIC
BACTERIA UA: NONE SEEN
Bilirubin Urine: NEGATIVE
Glucose, UA: >=500 mg/dL — AB
Ketones, ur: 20 mg/dL — AB
LEUKOCYTES UA: NEGATIVE
Nitrite: NEGATIVE
PROTEIN: NEGATIVE mg/dL
SPECIFIC GRAVITY, URINE: 1.008 (ref 1.005–1.030)
Squamous Epithelial / LPF: NONE SEEN (ref 0–5)
pH: 5 (ref 5.0–8.0)

## 2018-06-19 LAB — URINE DRUG SCREEN, QUALITATIVE (ARMC ONLY)
AMPHETAMINES, UR SCREEN: NOT DETECTED
BENZODIAZEPINE, UR SCRN: NOT DETECTED
Barbiturates, Ur Screen: NOT DETECTED
Cannabinoid 50 Ng, Ur ~~LOC~~: NOT DETECTED
Cocaine Metabolite,Ur ~~LOC~~: POSITIVE — AB
MDMA (ECSTASY) UR SCREEN: NOT DETECTED
METHADONE SCREEN, URINE: NOT DETECTED
Opiate, Ur Screen: NOT DETECTED
Phencyclidine (PCP) Ur S: NOT DETECTED
TRICYCLIC, UR SCREEN: NOT DETECTED

## 2018-06-19 LAB — LIPID PANEL
CHOL/HDL RATIO: 3 ratio
CHOLESTEROL: 192 mg/dL (ref 0–200)
HDL: 65 mg/dL (ref >40)
LDL CALC: 114 mg/dL — AB (ref 0–99)
TRIGLYCERIDES: 63 mg/dL (ref <150)
VLDL: 13 mg/dL (ref 0–40)

## 2018-06-19 LAB — APTT: APTT: 28 s (ref 24–36)

## 2018-06-19 LAB — GLUCOSE, CAPILLARY
Glucose-Capillary: 363 mg/dL — ABNORMAL HIGH (ref 70–99)
Glucose-Capillary: 413 mg/dL — ABNORMAL HIGH (ref 70–99)

## 2018-06-19 LAB — HEPARIN LEVEL (UNFRACTIONATED): Heparin Unfractionated: <0.1 IU/mL — ABNORMAL LOW (ref 0.30–0.70)

## 2018-06-19 LAB — TSH: TSH: 0.302 u[IU]/mL — ABNORMAL LOW (ref 0.350–4.500)

## 2018-06-19 LAB — MAGNESIUM: MAGNESIUM: 1.7 mg/dL (ref 1.7–2.4)

## 2018-06-19 LAB — PROTIME-INR
INR: 0.98
Prothrombin Time: 12.9 seconds (ref 11.4–15.2)

## 2018-06-19 LAB — BRAIN NATRIURETIC PEPTIDE: B NATRIURETIC PEPTIDE 5: 1159 pg/mL — AB (ref 0.0–100.0)

## 2018-06-19 MED ORDER — SPIRONOLACTONE 25 MG PO TABS
12.5000 mg | ORAL_TABLET | Freq: Every day | ORAL | Status: DC
  Administered 2018-06-19: 12.5 mg via ORAL
  Filled 2018-06-19: qty 1
  Filled 2018-06-19 (×2): qty 0.5

## 2018-06-19 MED ORDER — OXYCODONE HCL 5 MG PO TABS
5.0000 mg | ORAL_TABLET | Freq: Four times a day (QID) | ORAL | Status: DC | PRN
  Administered 2018-06-19 – 2018-06-20 (×3): 5 mg via ORAL
  Filled 2018-06-19 (×3): qty 1

## 2018-06-19 MED ORDER — PNEUMOCOCCAL VAC POLYVALENT 25 MCG/0.5ML IJ INJ: 0.5000 mL | INJECTION | INTRAMUSCULAR | Status: DC

## 2018-06-19 MED ORDER — AMIODARONE HCL IN DEXTROSE 360-4.14 MG/200ML-% IV SOLN
30.0000 mg/h | INTRAVENOUS | Status: DC
  Administered 2018-06-19: 30 mg/h via INTRAVENOUS
  Filled 2018-06-19: qty 200

## 2018-06-19 MED ORDER — ACETAMINOPHEN 650 MG RE SUPP: 650.0000 mg | Freq: Four times a day (QID) | RECTAL | Status: DC | PRN

## 2018-06-19 MED ORDER — AMIODARONE HCL IN DEXTROSE 360-4.14 MG/200ML-% IV SOLN
60.0000 mg/h | INTRAVENOUS | Status: AC
  Administered 2018-06-19 (×2): 60 mg/h via INTRAVENOUS
  Filled 2018-06-19 (×2): qty 200

## 2018-06-19 MED ORDER — GABAPENTIN 400 MG PO CAPS
800.0000 mg | ORAL_CAPSULE | Freq: Two times a day (BID) | ORAL | Status: DC
  Administered 2018-06-19 – 2018-06-20 (×2): 800 mg via ORAL
  Filled 2018-06-19 (×2): qty 2

## 2018-06-19 MED ORDER — CLOPIDOGREL BISULFATE 75 MG PO TABS
75.0000 mg | ORAL_TABLET | Freq: Every day | ORAL | Status: DC
  Administered 2018-06-19 – 2018-06-20 (×2): 75 mg via ORAL
  Filled 2018-06-19 (×2): qty 1

## 2018-06-19 MED ORDER — ROSUVASTATIN CALCIUM 10 MG PO TABS
20.0000 mg | ORAL_TABLET | Freq: Every day | ORAL | Status: DC
  Administered 2018-06-19: 20 mg via ORAL
  Filled 2018-06-19: qty 2

## 2018-06-19 MED ORDER — INSULIN ASPART 100 UNIT/ML ~~LOC~~ SOLN: 0.0000 [IU] | Freq: Every day | SUBCUTANEOUS | Status: DC

## 2018-06-19 MED ORDER — INSULIN ASPART 100 UNIT/ML ~~LOC~~ SOLN
0.0000 [IU] | Freq: Three times a day (TID) | SUBCUTANEOUS | Status: DC
  Administered 2018-06-19: 15 [IU] via SUBCUTANEOUS
  Administered 2018-06-20 (×2): 8 [IU] via SUBCUTANEOUS
  Filled 2018-06-19 (×3): qty 1

## 2018-06-19 MED ORDER — HEPARIN (PORCINE) IN NACL 100-0.45 UNIT/ML-% IJ SOLN
1250.0000 [IU]/h | INTRAMUSCULAR | Status: DC
  Administered 2018-06-19: 750 [IU]/h via INTRAVENOUS
  Administered 2018-06-20: 1250 [IU]/h via INTRAVENOUS
  Filled 2018-06-19 (×2): qty 250

## 2018-06-19 MED ORDER — FUROSEMIDE 10 MG/ML IJ SOLN: 40.0000 mg | Freq: Every day | INTRAMUSCULAR | Status: DC

## 2018-06-19 MED ORDER — ADULT MULTIVITAMIN W/MINERALS CH
1.0000 | ORAL_TABLET | Freq: Every day | ORAL | Status: DC
  Administered 2018-06-19 – 2018-06-20 (×2): 1 via ORAL
  Filled 2018-06-19 (×2): qty 1

## 2018-06-19 MED ORDER — AMIODARONE HCL 200 MG PO TABS: 400.0000 mg | ORAL_TABLET | Freq: Every day | ORAL | Status: DC

## 2018-06-19 MED ORDER — ACETAMINOPHEN 325 MG PO TABS: 650.0000 mg | ORAL_TABLET | Freq: Four times a day (QID) | ORAL | Status: DC | PRN

## 2018-06-19 MED ORDER — ONDANSETRON HCL 4 MG PO TABS: 4.0000 mg | ORAL_TABLET | Freq: Four times a day (QID) | ORAL | Status: DC | PRN

## 2018-06-19 MED ORDER — HEPARIN BOLUS VIA INFUSION
1900.0000 [IU] | Freq: Once | INTRAVENOUS | Status: AC
  Administered 2018-06-19: 1900 [IU] via INTRAVENOUS
  Filled 2018-06-19: qty 1900

## 2018-06-19 MED ORDER — LORAZEPAM 2 MG/ML IJ SOLN: 1.0000 mg | Freq: Four times a day (QID) | INTRAMUSCULAR | Status: DC | PRN

## 2018-06-19 MED ORDER — LORAZEPAM 1 MG PO TABS
1.0000 mg | ORAL_TABLET | Freq: Four times a day (QID) | ORAL | Status: DC | PRN
  Administered 2018-06-20: 1 mg via ORAL
  Filled 2018-06-19: qty 1

## 2018-06-19 MED ORDER — NITROGLYCERIN 0.4 MG/HR TD PT24
0.4000 mg | MEDICATED_PATCH | Freq: Every day | TRANSDERMAL | Status: DC
  Administered 2018-06-19: 0.4 mg via TRANSDERMAL
  Filled 2018-06-19 (×2): qty 1

## 2018-06-19 MED ORDER — AMIODARONE LOAD VIA INFUSION
150.0000 mg | Freq: Once | INTRAVENOUS | Status: AC
  Administered 2018-06-19: 150 mg via INTRAVENOUS
  Filled 2018-06-19: qty 83.34

## 2018-06-19 MED ORDER — LISINOPRIL 5 MG PO TABS
5.0000 mg | ORAL_TABLET | Freq: Every day | ORAL | Status: DC
  Administered 2018-06-19: 5 mg via ORAL
  Filled 2018-06-19: qty 1

## 2018-06-19 MED ORDER — FOLIC ACID 1 MG PO TABS
1.0000 mg | ORAL_TABLET | Freq: Every day | ORAL | Status: DC
  Administered 2018-06-19 – 2018-06-20 (×2): 1 mg via ORAL
  Filled 2018-06-19 (×2): qty 1

## 2018-06-19 MED ORDER — POLYETHYLENE GLYCOL 3350 17 G PO PACK: 17.0000 g | PACK | Freq: Every day | ORAL | Status: DC | PRN

## 2018-06-19 MED ORDER — ASPIRIN 81 MG PO CHEW
81.0000 mg | CHEWABLE_TABLET | Freq: Every day | ORAL | Status: DC
  Administered 2018-06-19 – 2018-06-20 (×2): 81 mg via ORAL
  Filled 2018-06-19 (×2): qty 1

## 2018-06-19 MED ORDER — HEPARIN BOLUS VIA INFUSION
3800.0000 [IU] | Freq: Once | INTRAVENOUS | Status: AC
  Administered 2018-06-19: 3800 [IU] via INTRAVENOUS
  Filled 2018-06-19: qty 3800

## 2018-06-19 MED ORDER — CARVEDILOL 12.5 MG PO TABS
12.5000 mg | ORAL_TABLET | Freq: Two times a day (BID) | ORAL | Status: DC
  Administered 2018-06-19: 12.5 mg via ORAL
  Filled 2018-06-19: qty 1

## 2018-06-19 MED ORDER — ISOSORBIDE MONONITRATE ER 30 MG PO TB24
15.0000 mg | ORAL_TABLET | Freq: Every day | ORAL | Status: DC
  Administered 2018-06-19: 15 mg via ORAL
  Filled 2018-06-19: qty 1

## 2018-06-19 MED ORDER — INSULIN ASPART 100 UNIT/ML ~~LOC~~ SOLN
0.0000 [IU] | Freq: Three times a day (TID) | SUBCUTANEOUS | Status: DC
  Administered 2018-06-19: 15 [IU] via SUBCUTANEOUS
  Filled 2018-06-19: qty 1

## 2018-06-19 MED ORDER — ONDANSETRON HCL 4 MG/2ML IJ SOLN: 4.0000 mg | Freq: Four times a day (QID) | INTRAMUSCULAR | Status: DC | PRN

## 2018-06-19 MED ORDER — VITAMIN B-1 100 MG PO TABS
100.0000 mg | ORAL_TABLET | Freq: Every day | ORAL | Status: DC
  Administered 2018-06-19 – 2018-06-20 (×2): 100 mg via ORAL
  Filled 2018-06-19 (×2): qty 1

## 2018-06-19 MED ORDER — FUROSEMIDE 10 MG/ML IJ SOLN
40.0000 mg | Freq: Once | INTRAMUSCULAR | Status: AC
  Administered 2018-06-19: 40 mg via INTRAVENOUS
  Filled 2018-06-19: qty 4

## 2018-06-19 MED ORDER — MAGNESIUM OXIDE 400 (241.3 MG) MG PO TABS
400.0000 mg | ORAL_TABLET | Freq: Two times a day (BID) | ORAL | Status: DC
  Administered 2018-06-19 – 2018-06-20 (×2): 400 mg via ORAL
  Filled 2018-06-19 (×2): qty 1

## 2018-06-19 NOTE — Progress Notes (Signed)
Talked to Dr. Anne HahnWillis about patient's blood sugar at 413, change sliding scale to sensitive, order to give 15 units now then recheck blood sugar at midnight, if blood sugar is still above 300 then call MD back. Also talked about his troponin at 31.29, patient on heparin drip and amio drip, patient complaints of constant chest pain, has nitro patch on. RN will continue to monitor.

## 2018-06-19 NOTE — Progress Notes (Signed)
CRITICAL VALUE ALERT  Critical Value:  Troponin 34.00  Date & Time Notied: 06/19/2018  Provider Notified: Dr. Nancy MarusMayo  Orders Received/Actions taken: Patient's is in heparin drip and amio drip, patient still having chest pain, given oxycodone. Patient refuses nitro awhile ago, but is willing to try nitro patch now. MD will place order. RN will continue to monitor.
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
## 2018-06-19 NOTE — ED Notes (Signed)
Pt taken to inpt room 238 by this RN on cardiac monitor and continuous pulse ox

## 2018-06-19 NOTE — Consult Note (Signed)
Cardiology Consultation:   Patient ID: Mario Gallagher; [id number]; 1961-04-02   Admit date: 06/19/2018 Date of Consult: 06/19/2018  Primary Care Provider: System, Pcp Not In Primary Cardiologist: [facility]   Patient Profile:   Mario Gallagher is a 57 y.o. 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 Nancy Marus.  History of Present Illness:   Mario Gallagher suffered myocardial infarction in 2012 with an occluded LAD which was stented.  He subsequently underwent stenting of the RCA and left circumflex according to the notes. He was previously followed in [place], [place] though more recently has been followed at [facility]. He reports his ICD was implanted in 2016 and has had a total of 5 shocks prior to this admission. He was hospitalized in July at the [facility] [place] with chest pain. He underwent cardiac catheterization which showed chronically occluded RCA and LAD with collaterals. Medical therapy was recommended. He was readmitted to [facility] in early 05/2018 with chest pain. He underwent a [employer] which showed evidence of infarct in the anterior and RCA distribution without significant ischemia. EF was reported to be 31%. However, by echo his EF was 15 to 20%. He was noted to have a mural thrombus and was placed on Eliquis. He was recently admitted to [facility] in mid 8/019 with worsening chest pain and SOB in the setting of ongoing cocaine abuse and alcohol abuse. He was not compliant with his medications. He continued to ask for IV pain medications throughout his admission. He was ultimately discharged on Eliquis, amiodarone, Coreg, ASA, Lasix, lisinopril  spironolactone, and Crestor along with his non-cardiac medications. He did not follow up as an outpatient.   He has continued to abuse cocaine, last using 3 days prior. He has not been compliant with any medications since his discharge, stating "they were stolen." He developed palpitations with associated flushing on the night of 9/18. This was followed by shock x 2 from his ICD. Device interrogation showed VF s/p defibrillation x 2 per ED notes. In this setting, he developed chest, jaw, and left shoulder pain prompting him to come into the ED for evaluation. His EKG shows a paced rhythm. Troponin 27.96, BNP 1159, Mg++ 1.7, K+ 4.3, Scr 0.85, ethanol < 10, drug screen pending, CXR showed COPD with possible mild CHF. He has been started on IV heparin. He continues to ask for IV pain medication.   Past Medical History:  Diagnosis Date  . Acute MI (HCC)   . CHF (congestive heart failure) (HCC)   . Diabetes mellitus without complication (HCC)   . H/O blood clots   . Hyperlipemia   . Hypertension   . Left kidney mass     Past Surgical History:  Procedure Laterality Date  . CARDIAC DEFIBRILLATOR PLACEMENT    . HERNIA REPAIR       Home Meds: Prior to Admission medications   Medication Sig Start Date End Date Taking? Authorizing Provider  amiodarone (PACERONE) 400 MG tablet Take 1 tablet (400 mg total) by mouth daily. Patient taking differently: Take 200 mg by mouth daily.  05/21/18  Yes  Mayo, Allyn Kenner, MD  apixaban (ELIQUIS) 5 MG TABS tablet Take 1 tablet (5 mg total) by mouth 2 (two) times daily. 05/20/18  Yes Mayo, Allyn Kenner, MD  aspirin 81 MG chewable tablet Chew 1 tablet (81 mg total) by mouth daily. 05/21/18  Yes Mayo, Allyn Kenner, MD  carvedilol (COREG) 12.5 MG tablet Take 1 tablet (12.5 mg total) by mouth 2 (two) times daily with a meal. 05/20/18  Yes Mayo, Allyn Kenner, MD  clopidogrel (PLAVIX) 75 MG tablet Take 1 tablet by mouth daily. 04/11/18  Yes [provider]  furosemide (LASIX)  20 MG tablet Take 1 tablet (20 mg total) by mouth daily. 05/21/18  Yes Mayo, Allyn Kenner, MD  gabapentin (NEURONTIN) 800 MG tablet Take 1 tablet by mouth 2 (two) times daily. 04/28/18  Yes [provider]  isosorbide mononitrate (IMDUR) 30 MG 24 hr tablet Take 0.5 tablets (15 mg total) by mouth daily. 05/21/18  Yes Mayo, Allyn Kenner, MD  lisinopril (PRINIVIL,ZESTRIL) 5 MG tablet Take 1 tablet (5 mg total) by mouth daily. 05/21/18  Yes Mayo, Allyn Kenner, MD  metFORMIN (GLUCOPHAGE) 500 MG tablet Take 1 tablet (500 mg total) by mouth 2 (two) times daily with a meal. 05/20/18 06/19/18 Yes Mayo, Allyn Kenner, MD  oxyCODONE (OXY IR/ROXICODONE) 5 MG immediate release tablet Take 1 tablet (5 mg total) by mouth every 6 (six) hours as needed for moderate pain or severe pain. 05/20/18  Yes Mayo, Allyn Kenner, MD  rosuvastatin (CRESTOR) 20 MG tablet Take 1 tablet (20 mg total) by mouth daily at 6 PM. 05/20/18  Yes Mayo, Allyn Kenner, MD  spironolactone (ALDACTONE) 25 MG tablet Take 0.5 tablets (12.5 mg total) by mouth daily. 05/20/18  Yes Mayo, Allyn Kenner, MD    Inpatient Medications: Scheduled Meds:  Continuous Infusions: . heparin 750 Units/hr (06/19/18 1419)   PRN Meds:   Allergies:   Allergies  Allergen Reactions  . Codeine Swelling  . Penicillins Swelling    Has patient had a PCN reaction causing immediate rash, facial/tongue/throat swelling, SOB or lightheadedness with hypotension: Yes Has patient had a PCN reaction causing severe rash involving mucus membranes or skin necrosis: No Has patient had a PCN reaction that required hospitalization: No Has patient had a PCN reaction occurring within the last 10 years: No If all of the above answers are "NO", then may proceed with Cephalosporin use.  . Tramadol Swelling    Social History:   Social History   Socioeconomic History  . Marital status: Single    Spouse name: Not on file  . Number of children: Not on file  . Years of education: Not on file  .  Highest education level: Not on file  Occupational History  . Not on file  Social Needs  . Financial resource strain: Not on file  . Food insecurity:    Worry: Not on file    Inability: Not on file  . Transportation needs:    Medical: Not on file    Non-medical: Not on file  Tobacco Use  . Smoking status: Current Every Day Smoker    Packs/day: 0.50    Types: Cigarettes  . Smokeless tobacco: Never Used  Substance and Sexual Activity  . Alcohol use: No  . Drug use: No  . Sexual activity: Not on file  Lifestyle  . Physical activity:    Days per week: Not on file    Minutes per session: Not on file  . Stress: Not on file  Relationships  . Social connections:    Talks on phone: Not on file    Gets together: Not on file    Attends religious service: Not on file    Active member of club or organization: Not on file    Attends meetings of clubs or organizations: Not on file    Relationship status: Not on file  . Intimate partner violence:    Fear of current or ex partner: Not on file    Emotionally abused: Not on file    Physically abused: Not on file    Forced sexual activity: Not on file  Other Topics Concern  . Not on file  Social History Narrative  . Not on file     Family History:  Family History  Problem Relation Age of Onset  . Heart disease Father   . Hypertension Brother   . Hyperlipidemia Brother   . Rheumatic fever Mother   . Mitral valve prolapse Mother     ROS:  Review of Systems  Constitutional: Positive for malaise/fatigue. Negative for chills, diaphoresis, fever and weight loss.  HENT: Negative for congestion.   Eyes: Negative for discharge and redness.  Respiratory: Positive for shortness of breath. Negative for cough, hemoptysis, sputum production and wheezing.   Cardiovascular: Positive for chest pain and palpitations. Negative for orthopnea, claudication, leg swelling and PND.  Gastrointestinal: Negative for abdominal pain, blood in stool,  heartburn, melena, nausea and vomiting.  Genitourinary: Negative for hematuria.  Musculoskeletal: Positive for back pain, joint pain, myalgias and neck pain. Negative for falls.  Skin: Negative for rash.  Neurological: Positive for weakness. Negative for dizziness, tingling, tremors, sensory change, speech change, focal weakness and loss of consciousness.  Endo/Heme/Allergies: Does not bruise/bleed easily.  Psychiatric/Behavioral: Positive for substance abuse. The patient is not nervous/anxious.   All other systems reviewed and are negative.     Physical Exam/Data:   Vitals:   06/19/18 1301 06/19/18 1330 06/19/18 1400 06/19/18 1500  BP: 125/79 127/78 131/81 138/82  Pulse: 63 66 77 84  Resp: 17 17 16 19   Temp:      TempSrc:      SpO2: 95% 97% 96% 95%  Weight:      Height:       No intake or output data in the 24 hours ending 06/19/18 1519 Filed Weights   06/19/18 1220  Weight: 62.6 kg   Body mass index is 20.98 kg/m.   Physical Exam: General: Frail and chronically ill appearing, in no acute distress, laying in bed comfortably.  Head: Normocephalic, atraumatic, sclera non-icteric, no xanthomas, nares without discharge.  Neck: Negative for carotid bruits. JVD not elevated. Lungs: Clear bilaterally to auscultation without wheezes, rales, or rhonchi. Breathing is unlabored. Heart: RRR with S1 S2. II/VI systolic murmur, no rubs, or gallops appreciated. Abdomen: Soft, non-tender, non-distended with normoactive bowel sounds. No hepatomegaly. No rebound/guarding. No obvious abdominal masses. Msk:  Strength and tone appear normal for age. Extremities: No clubbing or cyanosis. No edema. Distal pedal pulses are 2+ and equal bilaterally. Neuro: Alert and oriented X 3. No facial asymmetry. No focal deficit. Moves all extremities spontaneously. Psych:  Responds to questions appropriately with a normal affect.   EKG:  The EKG was personally reviewed and demonstrates: Paced, 74  bpm Telemetry:  Telemetry was personally reviewed and demonstrates: paced  Weights: Filed Weights   06/19/18 1220  Weight: 62.6 kg    Relevant CV Studies:  Echo 05/2018: Left Ventricle: Normal wall thickness. Cavity is  moderately dilated.  Ejection fraction is 15 - 20%. Left ventricular systolic segmental  function is severely decreased. Diastolic function evaluation is  inconclusive. Inconclusive left atrial pressure. Moderate size thrombus present. The thrombus is located in the apex. Right Ventricle: Normal cavity size and ejection fraction. There is mild mitral regurgitation. __________  LHC 03/2018: Coronary Angiography Coronary angiography showed the patient to have severe 3-vessel coronary artery disease  Comments on coronary anatomy: The LAD and RCA are chronic total occlusions. Medical management advised.  __________  Laboratory Data:  Chemistry Recent Labs  Lab 06/19/18 1232  NA 137  K 4.3  CL 99  CO2 24  GLUCOSE 252*  BUN 18  CREATININE 0.85  CALCIUM 9.4  GFRNONAA >60  GFRAA >60  ANIONGAP 14    No results for input(s): PROT, ALBUMIN, AST, ALT, ALKPHOS, BILITOT in the last 168 hours. Hematology Recent Labs  Lab 06/19/18 1232  WBC 19.1*  RBC 4.98  HGB 17.0  HCT 50.1  MCV 100.5*  MCH 34.2*  MCHC 34.0  RDW 14.5  PLT 211   Cardiac Enzymes Recent Labs  Lab 06/19/18 1232  TROPONINI 27.96*   No results for input(s): TROPIPOC in the last 168 hours.  BNP Recent Labs  Lab 06/19/18 1232  BNP 1,159.0*    DDimer No results for input(s): DDIMER in the last 168 hours.  Radiology/Studies:  Dg Chest 2 View  Result Date: 06/19/2018 IMPRESSION: COPD. Bilateral interstitial prominence may reflect low-grade CHF of cardiac or noncardiac cause. No definite cardiomegaly. No alveolar pneumonia. Electronically Signed   By: David  SwazilandJordan M.D.   On: 06/19/2018 12:53    Assessment and Plan:   1. 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 Ventricular fibrillation s/p ICD: -Status post defibrillation x 2 on 9/18 (total of 7 shocks for the life of his device) -Start IV amiodarone at 1 mg/hr in an effort to load him with plans to transition to PO prior to discharge -Continue Coreg -Potassium at goal > 4.0 -Replete magnesium to goal > 2.0 -Compliance is critical  -Check TSH  3. HFrEF secondary to ICM: -He does not appear grossly volume up -Continue Coreg, lisinopril, spironolactone, and Lasix -CHF education  4. Mural thrombus: -IV heparin while admitted as above -Transition back to Eliquis at discharge  -Will need follow up echo as an outpatient -Compliance is advised   5. Homelessness/noncompliance : -Recommend social worker consult to establish a long term plan to assist with decreasing recurrent admissions -Compliance advised   6. Polysubstance abuse: -Cessation is advised -Check drug screen    For questions or updates, please contact [facility] Please consult [email or web address] for contact info under Cardiology/STEMI.   Signed, Eula Listenyan Sherene Plancarte, PA-C [facility] Pager: [phone] 06/19/2018, 3:19 PM

## 2018-06-19 NOTE — ED Notes (Addendum)
Date and time results received: 06/19/18 1334  Test: Troponin Critical Value: 27.96  Name of Provider Notified: Don PerkingVeronese MD  Continue to monitor. Pt remains on ED cardiac monitor. ABCs intact. VSS. NAD.
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
## 2018-06-19 NOTE — Progress Notes (Signed)
Family Meeting Note  Advance Directive:no  Today a meeting took place with the Patient.  Patient is able to participate.  The following clinical team members were present during this meeting:MD  The following were discussed:Patient's diagnosis: , Patient's progosis: Unable to determine and Goals for treatment: Full Code  Additional follow-up to be provided: prn  Time spent during discussion:20 minutes  Mario Gallagher D Eddith Mentor, MD  
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
## 2018-06-19 NOTE — Progress Notes (Signed)
Talked to Dr. Nancy MarusMayo about patient's complaints of chest pain and head ache, refusing to take nitro as it give him severe headache, also explained to patient that they will not give him IV medication due to his polysubstance abuse, asked what helps with his pain and he said oxycodone, asked about his allergies to codeine he says that oxycodone does not affect him, order given.  Also talked to P.A Dunn about his order to start Amiodarone drip, he states that the indication for the drip was because his AICD fired for vfib when interrogated at ED, HR was 89 in pace rhythm. RN will continue to monitor.

## 2018-06-20 LAB — BASIC METABOLIC PANEL
Anion gap: 10 (ref 5–15)
BUN: 27 mg/dL — AB (ref 6–20)
CALCIUM: 8.9 mg/dL (ref 8.9–10.3)
CO2: 29 mmol/L (ref 22–32)
Chloride: 96 mmol/L — ABNORMAL LOW (ref 98–111)
Creatinine, Ser: 0.94 mg/dL (ref 0.61–1.24)
GFR calc Af Amer: >60 mL/min (ref >60)
GLUCOSE: 100 mg/dL — AB (ref 70–99)
Potassium: 3.8 mmol/L (ref 3.5–5.1)
Sodium: 135 mmol/L (ref 135–145)

## 2018-06-20 LAB — HEPARIN LEVEL (UNFRACTIONATED)
Heparin Unfractionated: <0.1 IU/mL — ABNORMAL LOW (ref 0.30–0.70)
Heparin Unfractionated: 0.32 IU/mL (ref 0.30–0.70)

## 2018-06-20 LAB — CBC
HCT: 44.6 % (ref 40.0–52.0)
Hemoglobin: 15.4 g/dL (ref 13.0–18.0)
MCH: 34.6 pg — AB (ref 26.0–34.0)
MCHC: 34.5 g/dL (ref 32.0–36.0)
MCV: 100.4 fL — AB (ref 80.0–100.0)
PLATELETS: 206 10*3/uL (ref 150–440)
RBC: 4.44 MIL/uL (ref 4.40–5.90)
RDW: 14.6 % — AB (ref 11.5–14.5)
WBC: 15.4 10*3/uL — AB (ref 3.8–10.6)

## 2018-06-20 LAB — TROPONIN I: TROPONIN I: 49.31 ng/mL — AB (ref <0.03)

## 2018-06-20 LAB — HEMOGLOBIN A1C
Hgb A1c MFr Bld: 11.1 % — ABNORMAL HIGH (ref 4.8–5.6)
Mean Plasma Glucose: 272 mg/dL

## 2018-06-20 LAB — GLUCOSE, CAPILLARY
Glucose-Capillary: 124 mg/dL — ABNORMAL HIGH (ref 70–99)
Glucose-Capillary: 257 mg/dL — ABNORMAL HIGH (ref 70–99)
Glucose-Capillary: 297 mg/dL — ABNORMAL HIGH (ref 70–99)

## 2018-06-20 LAB — HIV ANTIBODY (ROUTINE TESTING W REFLEX): HIV SCREEN 4TH GENERATION: NONREACTIVE

## 2018-06-20 MED ORDER — METFORMIN HCL 500 MG PO TABS: 500.0000 mg | ORAL_TABLET | Freq: Two times a day (BID) | ORAL | Status: DC

## 2018-06-20 MED ORDER — HEPARIN BOLUS VIA INFUSION
1900.0000 [IU] | Freq: Once | INTRAVENOUS | Status: AC
  Administered 2018-06-20: 1900 [IU] via INTRAVENOUS
  Filled 2018-06-20: qty 1900

## 2018-06-20 MED ORDER — CARVEDILOL 3.125 MG PO TABS: 3.1250 mg | ORAL_TABLET | Freq: Two times a day (BID) | ORAL | Status: DC

## 2018-06-20 MED ORDER — CARVEDILOL 3.125 MG PO TABS: 12.5000 mg | ORAL_TABLET | Freq: Two times a day (BID) | ORAL | 0 refills | Status: AC

## 2018-06-20 MED ORDER — FUROSEMIDE 40 MG PO TABS
40.0000 mg | ORAL_TABLET | Freq: Every day | ORAL | Status: DC
  Administered 2018-06-20: 40 mg via ORAL
  Filled 2018-06-20: qty 1
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
## 2018-06-20 NOTE — Clinical Social Work Note (Signed)
CSW consulted for homelessness. CSW met with patient but he is ready for discharge and not interested in any resources. Patient did request a bus pass which CSW gave. No other needs.   [place], [place]

## 2018-06-20 NOTE — Progress Notes (Signed)
Patient showered before discharge. Tele and both IV's removed. Explained discharge instructions to patient and he verbalized understanding. Hard copy prescriptions given to patient. Bus voucher given to pt via Child psychotherapistsocial worker. Pt wheeled down via wheelchair through medical mall for discharge.  Milta DeitersJoy Anda Sobotta, RN 06/20/18 1430

## 2018-06-20 NOTE — Progress Notes (Signed)
Progress Note  Patient Name: Mario Gallagher Date of Encounter: 06/20/2018  Primary Cardiologist: No primary care provider on file.   Subjective   He complains of sharp chest pain when he takes a deep breath.  He is asking for narcotic pain medications.  He reports that he uses cocaine in order to relieve his chronic pain.  Inpatient Medications    Scheduled Meds: . aspirin  81 mg Oral Daily  . carvedilol  3.125 mg Oral BID WC  . clopidogrel  75 mg Oral Daily  . folic acid  1 mg Oral Daily  . furosemide  40 mg Intravenous Daily  . gabapentin  800 mg Oral BID  . insulin aspart  0-15 Units Subcutaneous TID AC & HS  . isosorbide mononitrate  15 mg Oral Daily  . lisinopril  5 mg Oral Daily  . magnesium oxide  400 mg Oral BID  . multivitamin with minerals  1 tablet Oral Daily  . nitroGLYCERIN  0.4 mg Transdermal Daily  . pneumococcal 23 valent vaccine  0.5 mL Intramuscular Tomorrow-1000  . rosuvastatin  20 mg Oral q1800  . spironolactone  12.5 mg Oral Daily  . thiamine  100 mg Oral Daily   Continuous Infusions: . heparin 1,250 Units/hr (06/20/18 0828)   PRN Meds: acetaminophen **OR** acetaminophen, LORazepam **OR** LORazepam, ondansetron **OR** ondansetron (ZOFRAN) IV, oxyCODONE, polyethylene glycol   Vital Signs    Vitals:   06/20/18 0530 06/20/18 0600 06/20/18 0716 06/20/18 0748  BP: 93/60 98/62 (!) 94/51 96/62  Pulse: 63 66 70 66  Resp:    18  Temp:    98.4 F (36.9 C)  TempSrc:    Oral  SpO2:    95%  Weight:      Height:        Intake/Output Summary (Last 24 hours) at 06/20/2018 0940 Last data filed at 06/20/2018 0602 Gross per 24 hour  Intake 953.65 ml  Output 1750 ml  Net -796.35 ml   Filed Weights   06/19/18 1220 06/19/18 1600  Weight: 62.6 kg 54.6 kg    Telemetry    Normal sinus rhythm with heart rate in the 70s.- Personally Reviewed  ECG     - Personally Reviewed  Physical Exam   GEN: No acute distress.   Neck: No JVD Cardiac: RRR, no  murmurs, rubs, or gallops.  Respiratory: Clear to auscultation bilaterally. GI: Soft, nontender, non-distended  MS: No edema; No deformity. Neuro:  Nonfocal  Psych: Normal affect   Labs    Chemistry Recent Labs  Lab 06/19/18 1232 06/20/18 0339  NA 137 135  K 4.3 3.8  CL 99 96*  CO2 24 29  GLUCOSE 252* 100*  BUN 18 27*  CREATININE 0.85 0.94  CALCIUM 9.4 8.9  GFRNONAA >60 >60  GFRAA >60 >60  ANIONGAP 14 10     Hematology Recent Labs  Lab 06/19/18 1232 06/20/18 0339  WBC 19.1* 15.4*  RBC 4.98 4.44  HGB 17.0 15.4  HCT 50.1 44.6  MCV 100.5* 100.4*  MCH 34.2* 34.6*  MCHC 34.0 34.5  RDW 14.5 14.6*  PLT 211 206    Cardiac Enzymes Recent Labs  Lab 06/19/18 1232 06/19/18 1637 06/19/18 2035 06/20/18 0339  TROPONINI 27.96* 34.00* 31.29* 49.31*   No results for input(s): TROPIPOC in the last 168 hours.   BNP Recent Labs  Lab 06/19/18 1232  BNP 1,159.0*     DDimer No results for input(s): DDIMER in the last 168 hours.   Radiology  Dg Chest 2 View  Result Date: 06/19/2018 CLINICAL DATA:  Onset of chest pain last night radiating to the arms as well as jaw all. Associated shortness of breath. History of coronary artery disease with stent placement. History of CHF, previous MI, current smoker. EXAM: CHEST - 2 VIEW COMPARISON:  PA chest x-ray dated May 19, 2018 FINDINGS: The lungs are well-expanded. The interstitial markings are increased compared to the previous study. The heart is normal in size. The pulmonary vascularity is mildly prominent centrally but stable. The ICD is in stable position. There is no pleural effusion or pneumothorax. The observed bony thorax is unremarkable. IMPRESSION: COPD. Bilateral interstitial prominence may reflect low-grade CHF of cardiac or noncardiac cause. No definite cardiomegaly. No alveolar pneumonia. Electronically Signed   By: David  Swaziland M.D.   On: 06/19/2018 12:53    Cardiac Studies     Patient Profile     57 y.o.  male with known history of coronary artery disease status post PCI to the LAD and left circumflex with recent cardiac catheterization in July showing chronically occluded LAD and RCA with collaterals, ischemic cardiomyopathy with an EF of 15 to 20% status post biventricular ICD placement, recurrent ventricular tachycardia, mural thrombus, homelessness and multidrug abuse including cocaine, tobacco and alcohol.  He presented with ICD shock and chest pain.  Assessment & Plan    1.  ICD shock: The patient had ventricular fibrillation on device interrogation and was shocked successfully with ICD.  Ventricular fibrillation is likely due to ongoing coronary ischemia in the setting of known coronary artery disease and cocaine use.  Recommend oral amiodarone as he was supposed to be on this.  2.  Non-ST elevation myocardial infarction: The patient had cardiac catheterization in July at an outside hospital that showed CTO of the LAD and RCA with collaterals.  Recommend continuing medical therapy.  In spite of significant troponin elevation, I do not think will be able to offer much help to this patient who was not compliant with medications and continues to use cocaine.  Continue heparin for another day.  Continue aspirin and Plavix.  3.  Chronic systolic heart failure due to ischemic cardiomyopathy: I decreased carvedilol due to low blood pressure.  Continue low-dose lisinopril.  He does not appear to be significantly volume overloaded.  Switch furosemide to oral.  4.  Social: The patient is homeless and has not been able to obtain in his medications.  He has [place] Medicaid.  I agree with social work consult.  Overall prognosis is very poor.      For questions or updates, please contact [facility] Please consult [email or web address] for contact info under        Signed, Lorine Bears, MD  06/20/2018, 9:40 AM

## 2018-06-20 NOTE — Progress Notes (Signed)
ANTICOAGULATION CONSULT NOTE - Initial Consult  Pharmacy Consult for Heparin Drip Indication: chest pain/ACS  Allergies  Allergen Reactions  . Codeine Swelling  . Penicillins Swelling    Has patient had a PCN reaction causing immediate rash, facial/tongue/throat swelling, SOB or lightheadedness with hypotension: Yes Has patient had a PCN reaction causing severe rash involving mucus membranes or skin necrosis: No Has patient had a PCN reaction that required hospitalization: No Has patient had a PCN reaction occurring within the last 10 years: No If all of the above answers are "NO", then may proceed with Cephalosporin use.  . Tramadol Swelling    Patient Measurements: Height: 5\' 8"  (172.7 cm) Weight: 120 lb 5.9 oz (54.6 kg) IBW/kg (Calculated) : 68.4 Heparin Dosing Weight: 62.6 kg  Vital Signs: Temp: 98.4 F (36.9 C) (09/20 0748) Temp Source: Oral (09/20 0748) BP: 108/66 (09/20 1221) Pulse Rate: 69 (09/20 1221)  Labs: Recent Labs    06/19/18 1232 06/19/18 1637 06/19/18 2034 06/19/18 2035 06/20/18 0339 06/20/18 1244  HGB 17.0  --   --   --  15.4  --   HCT 50.1  --   --   --  44.6  --   PLT 211  --   --   --  206  --   APTT 28  --   --   --   --   --   LABPROT 12.9  --   --   --   --   --   INR 0.98  --   --   --   --   --   HEPARINUNFRC  --   --  <0.10*  --  <0.10* 0.32  CREATININE 0.85  --   --   --  0.94  --   TROPONINI 27.96* 34.00*  --  31.29* 49.31*  --     Estimated Creatinine Clearance: 66.2 mL/min (by C-G formula based on SCr of 0.94 mg/dL).   Medical History: Past Medical History:  Diagnosis Date  . Acute MI (HCC)   . CHF (congestive heart failure) (HCC)   . Diabetes mellitus without complication (HCC)   . H/O blood clots   . Hyperlipemia   . Hypertension   . Left kidney mass     Medications:  Scheduled:  . aspirin  81 mg Oral Daily  . carvedilol  3.125 mg Oral BID WC  . clopidogrel  75 mg Oral Daily  . folic acid  1 mg Oral Daily  .  furosemide  40 mg Oral Daily  . gabapentin  800 mg Oral BID  . insulin aspart  0-15 Units Subcutaneous TID AC & HS  . isosorbide mononitrate  15 mg Oral Daily  . lisinopril  5 mg Oral Daily  . magnesium oxide  400 mg Oral BID  . metFORMIN  500 mg Oral BID WC  . multivitamin with minerals  1 tablet Oral Daily  . nitroGLYCERIN  0.4 mg Transdermal Daily  . pneumococcal 23 valent vaccine  0.5 mL Intramuscular Tomorrow-1000  . rosuvastatin  20 mg Oral q1800  . spironolactone  12.5 mg Oral Daily  . thiamine  100 mg Oral Daily   Infusions:  . heparin 1,250 Units/hr (06/20/18 [id number])    Assessment: 57 yo M to start Heparin Drip for ACS/STEMI. Patient is supposed to be on Apixaban for LV Thrombus but is homeless and states (per ED MD note) that meds were stolen and hasn't taken any medications in ~ 3.5 weeks. Hx;  MI, pacemaker, LV thrombus, last cocaine 3days PTA Hgb 17.0  Plt 211  INR 0.98,  APTT 28  Goal of Therapy:  Heparin level 0.3-0.7 units/ml Monitor platelets by anticoagulation protocol: Yes   Plan:  Heparin level therapeutic. Continue drip at same rate. Recheck in 6 hours  Malichi Palardy D Pedrohenrique Mccon[place], Pharm.D, BCPS Clinical Pharmacist 06/20/2018,2:01 PM

## 2018-06-20 NOTE — Discharge Summary (Signed)
[facility] - [place] at [facility]   PATIENT NAME: Mario Gallagher    MR#:  [id number]  DATE OF BIRTH:  [birth year]  DATE OF ADMISSION:  06/19/2018 ADMITTING PHYSICIAN: Campbell Stall, MD  DATE OF DISCHARGE: 06/20/2018  PRIMARY CARE PHYSICIAN: System, Pcp Not In    ADMISSION DIAGNOSIS:  Ventricular arrhythmia [I49.9] Cocaine abuse (HCC) [F14.10] NSTEMI (non-ST elevated myocardial infarction) (HCC) [I21.4]  DISCHARGE DIAGNOSIS:  ventricular arrhythmias status post ICD shock with elevated troponin post shock severe cardiomyopathy EF of 15 to 20% status post ICD placement history of coronary artery disease severe polysubstance abuse-- cocaine, alcohol, narcotics, tobacco noncompliance  SECONDARY DIAGNOSIS:   Past Medical History:  Diagnosis Date  . Acute MI (HCC)   . CHF (congestive heart failure) (HCC)   . Diabetes mellitus without complication (HCC)   . H/O blood clots   . Hyperlipemia   . Hypertension   . Left kidney mass     HOSPITAL COURSE:   *Acute elevated troponin status post AICD shock with history of coronary artery disease - has a history of MI x 2 with 2 stents placed to the LAD in 2012. Had recent cath at [facility] that showed severe multivessel disease, however was not felt to be a candidate due to active drug abuse.  - EKG without any ST or T wave changes. -Patient received IV heparin drip - cardiology consulted-by Dr. Kirke Corin. Patient has significant history of noncompliance with medical follow-up, medication, active cocaine abuse despite counseling and educating on and off.  -Cardiology does not plan on any further workup given his noncompliance. -His cardiac meds have been resumed - continue aspirin, plavix,IMDUR, lisinopril and coreg - NO referral to Cardiac Rehab given his homelessness, non-compliance, primary cardiologist at [facility], and mailing address in [place], etc  *V-fib- AICD interrogation showed two episodes of v-fib last night  for which patient was defibrillated. In NSR here. - continue amiodarone -troponin elevated after ICD shock. Appears demand ischemia.  *Chronic systolic heart failure with AICD- does not appear volume overloaded, although BNP elevated to 1,159. Last ECHO 04/23/18 (at [facility]) with EF 15-20% - lasix 20 MG daily - continue home coreg, lisinopril, spironolactone  *LV apical thrombus- seen on last ECHO 04/23/18.  -pt noncomplain with anticoagulationand f/u  *Hypertension- normotensive - continue coreg, lisinopril  *Type 2 diabetes- uncontrolled. On metformin at home. - moderate SSI  *Hyperlipidemia- stable - continue crestor  *Polysubstance abuse- last used cocaine 3 days ago. Last used alcohol 1 month ago. Smokes 1 pack per day. - UDS positive for cocaine - pt advised to quit drugs--not sure he gets the message!!  pt has been asking for narcotics, Ativan, Klonopin repeatedly. I have told him he needs to follow up with his pain clinic Dr. and primary care physician.   From cardiology standpoint to go home. Will discharge patient. Care Management to see patient prior to discharge  CONSULTS OBTAINED:  Treatment Team:  Antonieta Iba, MD  DRUG ALLERGIES:   Allergies  Allergen Reactions  . Codeine Swelling  . Penicillins Swelling    Has patient had a PCN reaction causing immediate rash, facial/tongue/throat swelling, SOB or lightheadedness with hypotension: Yes Has patient had a PCN reaction causing severe rash involving mucus membranes or skin necrosis: No Has patient had a PCN reaction that required hospitalization: No Has patient had a PCN reaction occurring within the last 10 years: No If all of the above answers are "NO", then may proceed with Cephalosporin use.  Marland Kitchen  Tramadol Swelling    DISCHARGE MEDICATIONS:   Allergies as of 06/20/2018      Reactions   Codeine Swelling   Penicillins Swelling   Has patient had a PCN reaction causing immediate rash,  facial/tongue/throat swelling, SOB or lightheadedness with hypotension: Yes Has patient had a PCN reaction causing severe rash involving mucus membranes or skin necrosis: No Has patient had a PCN reaction that required hospitalization: No Has patient had a PCN reaction occurring within the last 10 years: No If all of the above answers are "NO", then may proceed with Cephalosporin use.   Tramadol Swelling      Medication List    STOP taking these medications   apixaban 5 MG Tabs tablet Commonly known as:  ELIQUIS   oxyCODONE 5 MG immediate release tablet Commonly known as:  Oxy IR/ROXICODONE     TAKE these medications   amiodarone 400 MG tablet Commonly known as:  PACERONE Take 1 tablet (400 mg total) by mouth daily. What changed:  how much to take   aspirin 81 MG chewable tablet Chew 1 tablet (81 mg total) by mouth daily.   carvedilol 3.125 MG tablet Commonly known as:  COREG Take 4 tablets (12.5 mg total) by mouth 2 (two) times daily with a meal. What changed:  medication strength   clopidogrel 75 MG tablet Commonly known as:  PLAVIX Take 1 tablet by mouth daily.   furosemide 20 MG tablet Commonly known as:  LASIX Take 1 tablet (20 mg total) by mouth daily.   gabapentin 800 MG tablet Commonly known as:  NEURONTIN Take 1 tablet by mouth 2 (two) times daily.   isosorbide mononitrate 30 MG 24 hr tablet Commonly known as:  IMDUR Take 0.5 tablets (15 mg total) by mouth daily.   lisinopril 5 MG tablet Commonly known as:  PRINIVIL,ZESTRIL Take 1 tablet (5 mg total) by mouth daily.   metFORMIN 500 MG tablet Commonly known as:  GLUCOPHAGE Take 1 tablet (500 mg total) by mouth 2 (two) times daily with a meal.   rosuvastatin 20 MG tablet Commonly known as:  CRESTOR Take 1 tablet (20 mg total) by mouth daily at 6 PM.   spironolactone 25 MG tablet Commonly known as:  ALDACTONE Take 0.5 tablets (12.5 mg total) by mouth daily.       If you experience worsening of  your admission symptoms, develop shortness of breath, life threatening emergency, suicidal or homicidal thoughts you must seek medical attention immediately by calling 911 or calling your MD immediately  if symptoms less severe.  You Must read complete instructions/literature along with all the possible adverse reactions/side effects for all the Medicines you take and that have been prescribed to you. Take any new Medicines after you have completely understood and accept all the possible adverse reactions/side effects.   Please note  You were cared for by a hospitalist during your hospital stay. If you have any questions about your discharge medications or the care you received while you were in the hospital after you are discharged, you can call the unit and asked to speak with the hospitalist on call if the hospitalist that took care of you is not available. Once you are discharged, your primary care physician will handle any further medical issues. Please note that NO REFILLS for any discharge medications will be authorized once you are discharged, as it is imperative that you return to your primary care physician (or establish a relationship with a primary care physician if  you do not have one) for your aftercare needs so that they can reassess your need for medications and monitor your lab values. Today   SUBJECTIVE   Wants his pain meds, ativan and klonopin  VITAL SIGNS:  Blood pressure 108/66, pulse 69, temperature 98.4 F (36.9 C), temperature source Oral, resp. rate 18, height 5\' 8"  (1.727 m), weight 54.6 kg, SpO2 95 %.  I/O:    Intake/Output Summary (Last 24 hours) at 06/20/2018 1320 Last data filed at 06/20/2018 1137 Gross per 24 hour  Intake 953.65 ml  Output 2350 ml  Net -1396.35 ml    PHYSICAL EXAMINATION:  GENERAL:  57 y.o.-year-old patient lying in the bed with no acute distress. Disheveled., poor hygiene EYES: Pupils equal, round, reactive to light and accommodation. No  scleral icterus. Extraocular muscles intact.  HEENT: Head atraumatic, normocephalic. Oropharynx and nasopharynx clear.  NECK:  Supple, no jugular venous distention. No thyroid enlargement, no tenderness.  LUNGS: Normal breath sounds bilaterally, no wheezing, rales,rhonchi or crepitation. No use of accessory muscles of respiration.  CARDIOVASCULAR: S1, S2 normal. No murmurs, rubs, or gallops.  ABDOMEN: Soft, non-tender, non-distended. Bowel sounds present. No organomegaly or mass.  EXTREMITIES: No pedal edema, cyanosis, or clubbing.  NEUROLOGIC: Cranial nerves II through XII are intact. Muscle strength 5/5 in all extremities. Sensation intact. Gait not checked.  PSYCHIATRIC: The patient is alert and oriented x 3.  SKIN: No obvious rash, lesion, or ulcer.   DATA REVIEW:   CBC  Recent Labs  Lab 06/20/18 0339  WBC 15.4*  HGB 15.4  HCT 44.6  PLT 206    Chemistries  Recent Labs  Lab 06/19/18 1232 06/20/18 0339  NA 137 135  K 4.3 3.8  CL 99 96*  CO2 24 29  GLUCOSE 252* 100*  BUN 18 27*  CREATININE 0.85 0.94  CALCIUM 9.4 8.9  MG 1.7  --     Microbiology Results   No results found for this or any previous visit (from the past 240 hour(s)).  RADIOLOGY:  Dg Chest 2 View  Result Date: 06/19/2018 CLINICAL DATA:  Onset of chest pain last night radiating to the arms as well as jaw all. Associated shortness of breath. History of coronary artery disease with stent placement. History of CHF, previous MI, current smoker. EXAM: CHEST - 2 VIEW COMPARISON:  PA chest x-ray dated May 19, 2018 FINDINGS: The lungs are well-expanded. The interstitial markings are increased compared to the previous study. The heart is normal in size. The pulmonary vascularity is mildly prominent centrally but stable. The ICD is in stable position. There is no pleural effusion or pneumothorax. The observed bony thorax is unremarkable. IMPRESSION: COPD. Bilateral interstitial prominence may reflect low-grade CHF  of cardiac or noncardiac cause. No definite cardiomegaly. No alveolar pneumonia. Electronically Signed   By: David  Swaziland M.D.   On: 06/19/2018 12:53     Management plans discussed with the patient, family and they are in agreement.  CODE STATUS:     Code Status Orders  (From admission, onward)         Start     Ordered   06/19/18 1451  Full code  Continuous     06/19/18 1450        Code Status History    Date Active Date Inactive Code Status Order ID Comments User Context   05/19/2018 2120 05/20/2018 1519 Full Code [id number]  Elder Negus, RN Inpatient      TOTAL TIME TAKING CARE OF  THIS PATIENT: *35* minutes.    Enedina Finner M.D on 06/20/2018 at 1:20 PM  Between 7am to 6pm - Pager - [phone] After 6pm go to [email or web address] - password [email or web address]  [facility]  Office  [phone]  CC: Primary care physician; System, Pcp Not In

## 2018-06-20 NOTE — Progress Notes (Addendum)
ANTICOAGULATION CONSULT NOTE - Initial Consult  Pharmacy Consult for Heparin Drip Indication: chest pain/ACS  Allergies  Allergen Reactions  . Codeine Swelling  . Penicillins Swelling    Has patient had a PCN reaction causing immediate rash, facial/tongue/throat swelling, SOB or lightheadedness with hypotension: Yes Has patient had a PCN reaction causing severe rash involving mucus membranes or skin necrosis: No Has patient had a PCN reaction that required hospitalization: No Has patient had a PCN reaction occurring within the last 10 years: No If all of the above answers are "NO", then may proceed with Cephalosporin use.  . Tramadol Swelling    Patient Measurements: Height: 5\' 8"  (172.7 cm) Weight: 120 lb 5.9 oz (54.6 kg) IBW/kg (Calculated) : 68.4 Heparin Dosing Weight: 62.6 kg  Vital Signs: Temp: 98.2 F (36.8 C) (09/19 1934) BP: 93/60 (09/20 0530) Pulse Rate: 63 (09/20 0530)  Labs: Recent Labs    06/19/18 1232 06/19/18 1637 06/19/18 2034 06/19/18 2035 06/20/18 0339  HGB 17.0  --   --   --  15.4  HCT 50.1  --   --   --  44.6  PLT 211  --   --   --  206  APTT 28  --   --   --   --   LABPROT 12.9  --   --   --   --   INR 0.98  --   --   --   --   HEPARINUNFRC  --   --  <0.10*  --  <0.10*  CREATININE 0.85  --   --   --  0.94  TROPONINI 27.96* 34.00*  --  31.29* 49.31*    Estimated Creatinine Clearance: 66.2 mL/min (by C-G formula based on SCr of 0.94 mg/dL).   Medical History: Past Medical History:  Diagnosis Date  . Acute MI (HCC)   . CHF (congestive heart failure) (HCC)   . Diabetes mellitus without complication (HCC)   . H/O blood clots   . Hyperlipemia   . Hypertension   . Left kidney mass     Medications:  Scheduled:  . aspirin  81 mg Oral Daily  . carvedilol  12.5 mg Oral BID WC  . clopidogrel  75 mg Oral Daily  . folic acid  1 mg Oral Daily  . furosemide  40 mg Intravenous Daily  . gabapentin  800 mg Oral BID  . insulin aspart  0-15 Units  Subcutaneous TID AC & HS  . isosorbide mononitrate  15 mg Oral Daily  . lisinopril  5 mg Oral Daily  . magnesium oxide  400 mg Oral BID  . multivitamin with minerals  1 tablet Oral Daily  . nitroGLYCERIN  0.4 mg Transdermal Daily  . pneumococcal 23 valent vaccine  0.5 mL Intramuscular Tomorrow-1000  . rosuvastatin  20 mg Oral q1800  . spironolactone  12.5 mg Oral Daily  . thiamine  100 mg Oral Daily   Infusions:  . heparin 1,000 Units/hr (06/20/18 0405)    Assessment: 57 yo M to start Heparin Drip for ACS/STEMI. Patient is supposed to be on Apixaban for LV Thrombus but is homeless and states (per ED MD note) that meds were stolen and hasn't taken any medications in ~ 3.5 weeks. Hx; MI, pacemaker, LV thrombus, last cocaine 3days PTA Hgb 17.0  Plt 211  INR 0.98,  APTT 28  Goal of Therapy:  Heparin level 0.3-0.7 units/ml Monitor platelets by anticoagulation protocol: Yes   Plan:  Give 3800 units  bolus x 1 Start heparin infusion at 750 units/hr Check anti-Xa level in 6 hours and daily while on heparin Continue to monitor H&H and platelets   9/19:  HL @ 20:30 = < 0.1 Will order Heparin 1900 units IV X 1 bolus and increase drip rate to 1000 units/hr.  Will recheck HL 6 hrs after rate change.   9/20 0330 heparin level <0.1. 1900 unit bolus and increase rate to 1250 units/hr. Recheck in 6 hours.  Aurora Rody S 06/20/2018,5:48 AM

## 2018-06-20 NOTE — Discharge Instructions (Signed)
Advised to stop drinking alcohol, tobacco use and cocaine use.

## 2018-06-20 NOTE — Plan of Care (Signed)
  Problem: Cardiac: Goal: Ability to achieve and maintain adequate cardiopulmonary perfusion will improve Outcome: Progressing   AV Pacing noted on telemetry.  MD made aware of hypotension; Amiodarone drip discontinued.  No further ectopics noted.

## 2018-06-22 ENCOUNTER — Observation Stay
Admission: EM | Admit: 2018-06-22 | Discharge: 2018-06-23 | Disposition: A | Discharge status: Home / Self Care | Payer: Medicaid - Out of State | Attending: Internal Medicine | Admitting: Internal Medicine

## 2018-06-22 ENCOUNTER — Other Ambulatory Visit: Payer: Self-pay

## 2018-06-22 ENCOUNTER — Emergency Department: Payer: Medicaid - Out of State

## 2018-06-22 DIAGNOSIS — I2510 Atherosclerotic heart disease of native coronary artery without angina pectoris: Secondary | ICD-10-CM | POA: Insufficient documentation

## 2018-06-22 DIAGNOSIS — Z7901 Long term (current) use of anticoagulants: Secondary | ICD-10-CM | POA: Insufficient documentation

## 2018-06-22 DIAGNOSIS — Z590 Homelessness: Secondary | ICD-10-CM | POA: Insufficient documentation

## 2018-06-22 DIAGNOSIS — Z888 Allergy status to other drugs, medicaments and biological substances status: Secondary | ICD-10-CM | POA: Diagnosis not present

## 2018-06-22 DIAGNOSIS — Z880 Allergy status to penicillin: Secondary | ICD-10-CM | POA: Insufficient documentation

## 2018-06-22 DIAGNOSIS — E785 Hyperlipidemia, unspecified: Secondary | ICD-10-CM | POA: Diagnosis not present

## 2018-06-22 DIAGNOSIS — Z794 Long term (current) use of insulin: Secondary | ICD-10-CM | POA: Diagnosis not present

## 2018-06-22 DIAGNOSIS — J9601 Acute respiratory failure with hypoxia: Secondary | ICD-10-CM | POA: Diagnosis present

## 2018-06-22 DIAGNOSIS — R079 Chest pain, unspecified: Secondary | ICD-10-CM | POA: Diagnosis present

## 2018-06-22 DIAGNOSIS — Z79899 Other long term (current) drug therapy: Secondary | ICD-10-CM | POA: Diagnosis not present

## 2018-06-22 DIAGNOSIS — Y901 Blood alcohol level of 20-39 mg/100 ml: Secondary | ICD-10-CM | POA: Diagnosis not present

## 2018-06-22 DIAGNOSIS — R748 Abnormal levels of other serum enzymes: Secondary | ICD-10-CM | POA: Diagnosis present

## 2018-06-22 DIAGNOSIS — Z95810 Presence of automatic (implantable) cardiac defibrillator: Secondary | ICD-10-CM | POA: Diagnosis not present

## 2018-06-22 DIAGNOSIS — F1010 Alcohol abuse, uncomplicated: Secondary | ICD-10-CM | POA: Insufficient documentation

## 2018-06-22 DIAGNOSIS — I498 Other specified cardiac arrhythmias: Secondary | ICD-10-CM | POA: Insufficient documentation

## 2018-06-22 DIAGNOSIS — E119 Type 2 diabetes mellitus without complications: Secondary | ICD-10-CM | POA: Insufficient documentation

## 2018-06-22 DIAGNOSIS — I509 Heart failure, unspecified: Secondary | ICD-10-CM

## 2018-06-22 DIAGNOSIS — Z9889 Other specified postprocedural states: Secondary | ICD-10-CM | POA: Diagnosis not present

## 2018-06-22 DIAGNOSIS — Z885 Allergy status to narcotic agent status: Secondary | ICD-10-CM | POA: Insufficient documentation

## 2018-06-22 DIAGNOSIS — I110 Hypertensive heart disease with heart failure: Secondary | ICD-10-CM | POA: Insufficient documentation

## 2018-06-22 DIAGNOSIS — I513 Intracardiac thrombosis, not elsewhere classified: Secondary | ICD-10-CM | POA: Insufficient documentation

## 2018-06-22 DIAGNOSIS — J90 Pleural effusion, not elsewhere classified: Secondary | ICD-10-CM | POA: Diagnosis not present

## 2018-06-22 DIAGNOSIS — Z9119 Patient's noncompliance with other medical treatment and regimen: Secondary | ICD-10-CM | POA: Insufficient documentation

## 2018-06-22 DIAGNOSIS — I25118 Atherosclerotic heart disease of native coronary artery with other forms of angina pectoris: Secondary | ICD-10-CM

## 2018-06-22 DIAGNOSIS — F17210 Nicotine dependence, cigarettes, uncomplicated: Secondary | ICD-10-CM | POA: Insufficient documentation

## 2018-06-22 DIAGNOSIS — I252 Old myocardial infarction: Secondary | ICD-10-CM | POA: Diagnosis not present

## 2018-06-22 DIAGNOSIS — I5043 Acute on chronic combined systolic (congestive) and diastolic (congestive) heart failure: Secondary | ICD-10-CM | POA: Diagnosis not present

## 2018-06-22 DIAGNOSIS — F1410 Cocaine abuse, uncomplicated: Secondary | ICD-10-CM | POA: Diagnosis not present

## 2018-06-22 DIAGNOSIS — Z7982 Long term (current) use of aspirin: Secondary | ICD-10-CM | POA: Diagnosis not present

## 2018-06-22 DIAGNOSIS — Z8249 Family history of ischemic heart disease and other diseases of the circulatory system: Secondary | ICD-10-CM | POA: Insufficient documentation

## 2018-06-22 DIAGNOSIS — R7989 Other specified abnormal findings of blood chemistry: Secondary | ICD-10-CM

## 2018-06-22 DIAGNOSIS — R778 Other specified abnormalities of plasma proteins: Secondary | ICD-10-CM

## 2018-06-22 LAB — URINE DRUG SCREEN, QUALITATIVE (ARMC ONLY)
AMPHETAMINES, UR SCREEN: NOT DETECTED
BARBITURATES, UR SCREEN: NOT DETECTED
BENZODIAZEPINE, UR SCRN: NOT DETECTED
CANNABINOID 50 NG, UR ~~LOC~~: NOT DETECTED
Cocaine Metabolite,Ur ~~LOC~~: POSITIVE — AB
MDMA (Ecstasy)Ur Screen: NOT DETECTED
Methadone Scn, Ur: NOT DETECTED
Opiate, Ur Screen: NOT DETECTED
PHENCYCLIDINE (PCP) UR S: NOT DETECTED
Tricyclic, Ur Screen: NOT DETECTED

## 2018-06-22 LAB — BASIC METABOLIC PANEL
ANION GAP: 17 — AB (ref 5–15)
Anion gap: 9 (ref 5–15)
BUN: 24 mg/dL — ABNORMAL HIGH (ref 6–20)
BUN: 34 mg/dL — AB (ref 6–20)
CALCIUM: 9.1 mg/dL (ref 8.9–10.3)
CHLORIDE: 89 mmol/L — AB (ref 98–111)
CHLORIDE: 88 mmol/L — AB (ref 98–111)
CO2: 23 mmol/L (ref 22–32)
CO2: 29 mmol/L (ref 22–32)
CREATININE: 0.97 mg/dL (ref 0.61–1.24)
Calcium: 8.8 mg/dL — ABNORMAL LOW (ref 8.9–10.3)
Creatinine, Ser: 1.12 mg/dL (ref 0.61–1.24)
GFR calc Af Amer: >60 mL/min (ref >60)
GFR calc non Af Amer: >60 mL/min (ref >60)
GFR calc non Af Amer: >60 mL/min (ref >60)
Glucose, Bld: 419 mg/dL — ABNORMAL HIGH (ref 70–99)
Glucose, Bld: 520 mg/dL (ref 70–99)
POTASSIUM: 4.6 mmol/L (ref 3.5–5.1)
Potassium: 5.2 mmol/L — ABNORMAL HIGH (ref 3.5–5.1)
Sodium: 129 mmol/L — ABNORMAL LOW (ref 135–145)
Sodium: 126 mmol/L — ABNORMAL LOW (ref 135–145)

## 2018-06-22 LAB — CBC
HCT: 40.7 % (ref 40.0–52.0)
HEMATOCRIT: 37.8 % — AB (ref 40.0–52.0)
Hemoglobin: 14.1 g/dL (ref 13.0–18.0)
Hemoglobin: 13.2 g/dL (ref 13.0–18.0)
MCH: 35.1 pg — AB (ref 26.0–34.0)
MCH: 34.9 pg — AB (ref 26.0–34.0)
MCHC: 34.7 g/dL (ref 32.0–36.0)
MCHC: 35 g/dL (ref 32.0–36.0)
MCV: 101.3 fL — AB (ref 80.0–100.0)
MCV: 99.6 fL (ref 80.0–100.0)
PLATELETS: 223 10*3/uL (ref 150–440)
Platelets: 211 10*3/uL (ref 150–440)
RBC: 4.02 MIL/uL — AB (ref 4.40–5.90)
RBC: 3.79 MIL/uL — AB (ref 4.40–5.90)
RDW: 14 % (ref 11.5–14.5)
RDW: 13.9 % (ref 11.5–14.5)
WBC: 11.4 10*3/uL — ABNORMAL HIGH (ref 3.8–10.6)
WBC: 11.5 10*3/uL — AB (ref 3.8–10.6)

## 2018-06-22 LAB — URINALYSIS, COMPLETE (UACMP) WITH MICROSCOPIC
BILIRUBIN URINE: NEGATIVE
Bacteria, UA: NONE SEEN
Hgb urine dipstick: NEGATIVE
Ketones, ur: 5 mg/dL — AB
LEUKOCYTES UA: NEGATIVE
NITRITE: NEGATIVE
PROTEIN: NEGATIVE mg/dL
SQUAMOUS EPITHELIAL / LPF: NONE SEEN (ref 0–5)
Specific Gravity, Urine: 1.027 (ref 1.005–1.030)
pH: 5 (ref 5.0–8.0)

## 2018-06-22 LAB — BLOOD GAS, ARTERIAL
ACID-BASE EXCESS: 1.8 mmol/L (ref 0.0–2.0)
BICARBONATE: 25.7 mmol/L (ref 20.0–28.0)
FIO2: 32
O2 Saturation: 80.1 %
PCO2 ART: 37 mmHg (ref 32.0–48.0)
PO2 ART: 42 mmHg — AB (ref 83.0–108.0)
Patient temperature: 37
pH, Arterial: 7.45 (ref 7.350–7.450)

## 2018-06-22 LAB — TROPONIN I
TROPONIN I: 8.22 ng/mL — AB (ref <0.03)
Troponin I: 8.4 ng/mL (ref <0.03)
Troponin I: 8.56 ng/mL (ref <0.03)
Troponin I: 8.45 ng/mL (ref <0.03)

## 2018-06-22 LAB — HEPARIN LEVEL (UNFRACTIONATED): Heparin Unfractionated: <0.1 IU/mL — ABNORMAL LOW (ref 0.30–0.70)

## 2018-06-22 LAB — GLUCOSE, CAPILLARY: Glucose-Capillary: 485 mg/dL — ABNORMAL HIGH (ref 70–99)

## 2018-06-22 LAB — PROTIME-INR
INR: 1.02
Prothrombin Time: 13.3 seconds (ref 11.4–15.2)

## 2018-06-22 LAB — APTT: aPTT: 31 seconds (ref 24–36)

## 2018-06-22 LAB — ETHANOL: Alcohol, Ethyl (B): 30 mg/dL — ABNORMAL HIGH (ref <10)

## 2018-06-22 MED ORDER — CLOPIDOGREL BISULFATE 75 MG PO TABS
75.0000 mg | ORAL_TABLET | Freq: Every day | ORAL | Status: DC
  Administered 2018-06-22 – 2018-06-23 (×2): 75 mg via ORAL
  Filled 2018-06-22 (×3): qty 1

## 2018-06-22 MED ORDER — LISINOPRIL 5 MG PO TABS
5.0000 mg | ORAL_TABLET | Freq: Every day | ORAL | Status: DC
  Administered 2018-06-22 – 2018-06-23 (×2): 5 mg via ORAL
  Filled 2018-06-22 (×2): qty 1

## 2018-06-22 MED ORDER — HEPARIN BOLUS VIA INFUSION
3300.0000 [IU] | Freq: Once | INTRAVENOUS | Status: AC
  Administered 2018-06-22: 3300 [IU] via INTRAVENOUS
  Filled 2018-06-22: qty 3300

## 2018-06-22 MED ORDER — SPIRONOLACTONE 25 MG PO TABS
12.5000 mg | ORAL_TABLET | Freq: Every day | ORAL | Status: DC
  Administered 2018-06-22 – 2018-06-23 (×2): 12.5 mg via ORAL
  Filled 2018-06-22: qty 0.5
  Filled 2018-06-22 (×2): qty 1
  Filled 2018-06-22: qty 0.5
  Filled 2018-06-22: qty 1

## 2018-06-22 MED ORDER — ASPIRIN EC 81 MG PO TBEC
81.0000 mg | DELAYED_RELEASE_TABLET | Freq: Every day | ORAL | Status: DC
  Administered 2018-06-23: 81 mg via ORAL
  Filled 2018-06-22: qty 1

## 2018-06-22 MED ORDER — AMIODARONE HCL 200 MG PO TABS
400.0000 mg | ORAL_TABLET | Freq: Every day | ORAL | Status: DC
  Administered 2018-06-22: 400 mg via ORAL
  Filled 2018-06-22: qty 2

## 2018-06-22 MED ORDER — ASPIRIN 300 MG RE SUPP: 300.0000 mg | RECTAL | Status: AC

## 2018-06-22 MED ORDER — AMIODARONE HCL 200 MG PO TABS
400.0000 mg | ORAL_TABLET | Freq: Every day | ORAL | Status: DC
  Administered 2018-06-23: 400 mg via ORAL
  Filled 2018-06-22: qty 2

## 2018-06-22 MED ORDER — HEPARIN BOLUS VIA INFUSION
1700.0000 [IU] | Freq: Once | INTRAVENOUS | Status: AC
  Administered 2018-06-22: 1700 [IU] via INTRAVENOUS
  Filled 2018-06-22: qty 1700

## 2018-06-22 MED ORDER — ASPIRIN 81 MG PO CHEW: 324.0000 mg | CHEWABLE_TABLET | Freq: Once | ORAL | Status: DC

## 2018-06-22 MED ORDER — CARVEDILOL 12.5 MG PO TABS: 12.5000 mg | ORAL_TABLET | Freq: Two times a day (BID) | ORAL | Status: DC

## 2018-06-22 MED ORDER — NITROGLYCERIN 0.4 MG SL SUBL: 0.4000 mg | SUBLINGUAL_TABLET | SUBLINGUAL | Status: DC | PRN

## 2018-06-22 MED ORDER — ROSUVASTATIN CALCIUM 10 MG PO TABS
20.0000 mg | ORAL_TABLET | Freq: Every day | ORAL | Status: DC
  Administered 2018-06-22: 20 mg via ORAL
  Filled 2018-06-22: qty 2

## 2018-06-22 MED ORDER — ACETAMINOPHEN 325 MG PO TABS: 650.0000 mg | ORAL_TABLET | ORAL | Status: DC | PRN

## 2018-06-22 MED ORDER — FUROSEMIDE 20 MG PO TABS
20.0000 mg | ORAL_TABLET | Freq: Every day | ORAL | Status: DC
  Administered 2018-06-22 – 2018-06-23 (×2): 20 mg via ORAL
  Filled 2018-06-22 (×2): qty 1

## 2018-06-22 MED ORDER — ONDANSETRON HCL 4 MG/2ML IJ SOLN: 4.0000 mg | Freq: Four times a day (QID) | INTRAMUSCULAR | Status: DC | PRN

## 2018-06-22 MED ORDER — SODIUM CHLORIDE 0.9% FLUSH
3.0000 mL | Freq: Two times a day (BID) | INTRAVENOUS | Status: DC
  Administered 2018-06-22 – 2018-06-23 (×2): 3 mL via INTRAVENOUS

## 2018-06-22 MED ORDER — APIXABAN 5 MG PO TABS: 5.0000 mg | ORAL_TABLET | Freq: Two times a day (BID) | ORAL | Status: DC

## 2018-06-22 MED ORDER — GABAPENTIN 400 MG PO CAPS
800.0000 mg | ORAL_CAPSULE | Freq: Two times a day (BID) | ORAL | Status: DC
  Administered 2018-06-22 – 2018-06-23 (×3): 800 mg via ORAL
  Filled 2018-06-22 (×3): qty 2

## 2018-06-22 MED ORDER — ASPIRIN 81 MG PO CHEW: 81.0000 mg | CHEWABLE_TABLET | Freq: Every day | ORAL | Status: DC

## 2018-06-22 MED ORDER — SODIUM CHLORIDE 0.9% FLUSH: 3.0000 mL | INTRAVENOUS | Status: DC | PRN

## 2018-06-22 MED ORDER — METFORMIN HCL 500 MG PO TABS
500.0000 mg | ORAL_TABLET | Freq: Two times a day (BID) | ORAL | Status: DC
  Administered 2018-06-22 – 2018-06-23 (×4): 500 mg via ORAL
  Filled 2018-06-22 (×4): qty 1

## 2018-06-22 MED ORDER — SODIUM CHLORIDE 0.9 % IV SOLN: 250.0000 mL | INTRAVENOUS | Status: DC | PRN

## 2018-06-22 MED ORDER — ASPIRIN 81 MG PO CHEW
324.0000 mg | CHEWABLE_TABLET | ORAL | Status: AC
  Administered 2018-06-22: 324 mg via ORAL
  Filled 2018-06-22: qty 4

## 2018-06-22 MED ORDER — CARVEDILOL 12.5 MG PO TABS
12.5000 mg | ORAL_TABLET | Freq: Two times a day (BID) | ORAL | Status: DC
  Administered 2018-06-22 – 2018-06-23 (×2): 12.5 mg via ORAL
  Filled 2018-06-22: qty 1
  Filled 2018-06-22: qty 2
  Filled 2018-06-22: qty 1

## 2018-06-22 MED ORDER — MORPHINE SULFATE (PF) 2 MG/ML IV SOLN
2.0000 mg | Freq: Four times a day (QID) | INTRAVENOUS | Status: DC | PRN
  Administered 2018-06-22 – 2018-06-23 (×3): 2 mg via INTRAVENOUS
  Filled 2018-06-22 (×3): qty 1

## 2018-06-22 MED ORDER — ISOSORBIDE MONONITRATE ER 30 MG PO TB24
15.0000 mg | ORAL_TABLET | Freq: Every day | ORAL | Status: DC
  Administered 2018-06-22 – 2018-06-23 (×2): 15 mg via ORAL
  Filled 2018-06-22 (×3): qty 1

## 2018-06-22 MED ORDER — HEPARIN (PORCINE) IN NACL 100-0.45 UNIT/ML-% IJ SOLN
1100.0000 [IU]/h | INTRAMUSCULAR | Status: DC
  Administered 2018-06-22: 650 [IU]/h via INTRAVENOUS
  Filled 2018-06-22 (×2): qty 250

## 2018-06-22 MED ORDER — INSULIN ASPART 100 UNIT/ML ~~LOC~~ SOLN
5.0000 [IU] | Freq: Once | SUBCUTANEOUS | Status: AC
  Administered 2018-06-22: 5 [IU] via INTRAVENOUS
  Filled 2018-06-22: qty 1

## 2018-06-22 MED ORDER — FUROSEMIDE 10 MG/ML IJ SOLN
60.0000 mg | Freq: Once | INTRAMUSCULAR | Status: AC
  Administered 2018-06-22: 60 mg via INTRAVENOUS
  Filled 2018-06-22: qty 8

## 2018-06-22 NOTE — ED Notes (Signed)
Report to Jennifer, RN

## 2018-06-22 NOTE — ED Provider Notes (Signed)
[facility] Emergency Department Provider Note    None    (approximate)  I have reviewed the triage vital signs and the nursing notes.   HISTORY  Chief Complaint Chest Pain    HPI Mario Gallagher is a 57 y.o. male since a past medical history with recent hospitalization and discharged just 2 days ago with extensive cardiac history noncompliance, polysubstance abuse and homelessness presents the ER for worsening chest pain that started last night and states that he feels like it is his previous heart attacks.  States the pressure in the mid chest constant nonradiating associated with some shortness of breath.  Patient found to be hypoxic on room air to the low 80s requiring supplemental oxygen.  States that he has been noncompliant with his medications since being discharged.    Past Medical History:  Diagnosis Date  . Acute MI (HCC)   . CHF (congestive heart failure) (HCC)   . Diabetes mellitus without complication (HCC)   . H/O blood clots   . Hyperlipemia   . Hypertension   . Left kidney mass    Family History  Problem Relation Age of Onset  . Heart disease Father   . Hypertension Brother   . Hyperlipidemia Brother   . Rheumatic fever Mother   . Mitral valve prolapse Mother    Past Surgical History:  Procedure Laterality Date  . CARDIAC DEFIBRILLATOR PLACEMENT    . HERNIA REPAIR     Patient Active Problem List   Diagnosis Date Noted  . NSTEMI (non-ST elevated myocardial infarction) (HCC) 06/19/2018  . Acute on chronic systolic (congestive) heart failure (HCC) 05/19/2018      Prior to Admission medications   Medication Sig Start Date End Date Taking? Authorizing Provider  amiodarone (PACERONE) 400 MG tablet Take 1 tablet (400 mg total) by mouth daily. Patient taking differently: Take 200 mg by mouth daily.  05/21/18   Mayo, Allyn Kenner, MD  aspirin 81 MG chewable tablet Chew 1 tablet (81 mg total) by mouth daily. 05/21/18   Mayo, Allyn Kenner,  MD  carvedilol (COREG) 3.125 MG tablet Take 4 tablets (12.5 mg total) by mouth 2 (two) times daily with a meal. 06/20/18   Enedina Finner, MD  clopidogrel (PLAVIX) 75 MG tablet Take 1 tablet by mouth daily. 04/11/18   [provider]  furosemide (LASIX) 20 MG tablet Take 1 tablet (20 mg total) by mouth daily. 05/21/18   Mayo, Allyn Kenner, MD  gabapentin (NEURONTIN) 800 MG tablet Take 1 tablet by mouth 2 (two) times daily. 04/28/18   [provider]  isosorbide mononitrate (IMDUR) 30 MG 24 hr tablet Take 0.5 tablets (15 mg total) by mouth daily. 05/21/18   Mayo, Allyn Kenner, MD  lisinopril (PRINIVIL,ZESTRIL) 5 MG tablet Take 1 tablet (5 mg total) by mouth daily. 05/21/18   Mayo, Allyn Kenner, MD  metFORMIN (GLUCOPHAGE) 500 MG tablet Take 1 tablet (500 mg total) by mouth 2 (two) times daily with a meal. 05/20/18 06/19/18  Mayo, Allyn Kenner, MD  rosuvastatin (CRESTOR) 20 MG tablet Take 1 tablet (20 mg total) by mouth daily at 6 PM. 05/20/18   Mayo, Allyn Kenner, MD  spironolactone (ALDACTONE) 25 MG tablet Take 0.5 tablets (12.5 mg total) by mouth daily. 05/20/18   Mayo, Allyn Kenner, MD    Allergies Codeine; Penicillins; and Tramadol    Social History Social History   Tobacco Use  . Smoking status: Current Every Day Smoker    Packs/day: 0.50  Types: Cigarettes  . Smokeless tobacco: Never Used  Substance Use Topics  . Alcohol use: No  . Drug use: No    Review of Systems Patient denies headaches, rhinorrhea, blurry vision, numbness, shortness of breath, chest pain, edema, cough, abdominal pain, nausea, vomiting, diarrhea, dysuria, fevers, rashes or hallucinations unless otherwise stated above in HPI. ____________________________________________   PHYSICAL EXAM:  VITAL SIGNS: Vitals:   06/22/18 0751 06/22/18 0820  BP:    Pulse: 93   Resp: 18   Temp:  98.7 F (37.1 C)  SpO2: 91%     Constitutional: Alert and oriented. Poorly kempt Eyes: Conjunctivae are normal.  Head:  Atraumatic. Nose: No congestion/rhinnorhea. Mouth/Throat: Mucous membranes are moist.   Neck: No stridor. Painless ROM.  Cardiovascular: Normal rate, regular rhythm. Grossly normal heart sounds.  Good peripheral circulation. Respiratory: Normal respiratory effort.  No retractions. Lungs CTAB. Gastrointestinal: Soft and nontender. No distention. No abdominal bruits. No CVA tenderness. Genitourinary:  Musculoskeletal: No lower extremity tenderness nor edema.  No joint effusions. Neurologic:  Normal speech and language. No gross focal neurologic deficits are appreciated. No facial droop Skin:  Skin is warm, dry and intact. No rash noted. Psychiatric: Mood and affect are normal.   ____________________________________________   LABS (all labs ordered are listed, but only abnormal results are displayed)  Results for orders placed or performed during the hospital encounter of 06/22/18 (from the past 24 hour(s))  Basic metabolic panel     Status: Abnormal   Collection Time: 06/22/18  7:21 AM  Result Value Ref Range   Sodium 129 (L) 135 - 145 mmol/L   Potassium 5.2 (H) 3.5 - 5.1 mmol/L   Chloride 89 (L) 98 - 111 mmol/L   CO2 23 22 - 32 mmol/L   Glucose, Bld 419 (H) 70 - 99 mg/dL   BUN 24 (H) 6 - 20 mg/dL   Creatinine, Ser 0.45 0.61 - 1.24 mg/dL   Calcium 9.1 8.9 - 40.9 mg/dL   GFR calc non Af Amer >60 >60 mL/min   GFR calc Af Amer >60 >60 mL/min   Anion gap 17 (H) 5 - 15  CBC     Status: Abnormal   Collection Time: 06/22/18  7:21 AM  Result Value Ref Range   WBC 11.4 (H) 3.8 - 10.6 K/uL   RBC 4.02 (L) 4.40 - 5.90 MIL/uL   Hemoglobin 14.1 13.0 - 18.0 g/dL   HCT 81.1 91.4 - 78.2 %   MCV 101.3 (H) 80.0 - 100.0 fL   MCH 35.1 (H) 26.0 - 34.0 pg   MCHC 34.7 32.0 - 36.0 g/dL   RDW 95.6 21.3 - 08.6 %   Platelets 211 150 - 440 K/uL  Troponin I     Status: Abnormal   Collection Time: 06/22/18  7:21 AM  Result Value Ref Range   Troponin I 8.22 (HH) <0.03 ng/mL  Urinalysis, Complete w  Microscopic     Status: Abnormal   Collection Time: 06/22/18  7:22 AM  Result Value Ref Range   Color, Urine YELLOW (A) YELLOW   APPearance CLEAR (A) CLEAR   Specific Gravity, Urine 1.027 1.005 - 1.030   pH 5.0 5.0 - 8.0   Glucose, UA >=500 (A) NEGATIVE mg/dL   Hgb urine dipstick NEGATIVE NEGATIVE   Bilirubin Urine NEGATIVE NEGATIVE   Ketones, ur 5 (A) NEGATIVE mg/dL   Protein, ur NEGATIVE NEGATIVE mg/dL   Nitrite NEGATIVE NEGATIVE   Leukocytes, UA NEGATIVE NEGATIVE   RBC / HPF 0-5  0 - 5 RBC/hpf   WBC, UA 0-5 0 - 5 WBC/hpf   Bacteria, UA NONE SEEN NONE SEEN   Squamous Epithelial / LPF NONE SEEN 0 - 5  Urine Drug Screen, Qualitative ([facility] only)     Status: Abnormal   Collection Time: 06/22/18  7:22 AM  Result Value Ref Range   Tricyclic, Ur Screen NONE DETECTED NONE DETECTED   Amphetamines, Ur Screen NONE DETECTED NONE DETECTED   MDMA (Ecstasy)Ur Screen NONE DETECTED NONE DETECTED   Cocaine Metabolite,Ur [place] POSITIVE (A) NONE DETECTED   Opiate, Ur Screen NONE DETECTED NONE DETECTED   Phencyclidine (PCP) Ur S NONE DETECTED NONE DETECTED   Cannabinoid 50 Ng, Ur [place] NONE DETECTED NONE DETECTED   Barbiturates, Ur Screen NONE DETECTED NONE DETECTED   Benzodiazepine, Ur Scrn NONE DETECTED NONE DETECTED   Methadone Scn, Ur NONE DETECTED NONE DETECTED  Ethanol     Status: Abnormal   Collection Time: 06/22/18  7:22 AM  Result Value Ref Range   Alcohol, Ethyl (B) 30 (H) <10 mg/dL  APTT     Status: None   Collection Time: 06/22/18  7:22 AM  Result Value Ref Range   aPTT 31 24 - 36 seconds  Protime-INR     Status: None   Collection Time: 06/22/18  7:22 AM  Result Value Ref Range   Prothrombin Time 13.3 11.4 - 15.2 seconds   INR 1.02   Blood gas, arterial     Status: Abnormal   Collection Time: 06/22/18  7:59 AM  Result Value Ref Range   FIO2 32.00    Delivery systems NASAL CANNULA    pH, Arterial 7.45 7.350 - 7.450   pCO2 arterial 37 32.0 - 48.0 mmHg   pO2, Arterial 42 (L) 83.0 -  108.0 mmHg   Bicarbonate 25.7 20.0 - 28.0 mmol/L   Acid-Base Excess 1.8 0.0 - 2.0 mmol/L   O2 Saturation 80.1 %   Patient temperature 37.0    Collection site LEFT RADIAL    Sample type ARTERIAL DRAW    Allens test (pass/fail) PASS PASS   ____________________________________________  EKG My review and personal interpretation at Time: 7:05   Indication: chest pain  Rate: 95  Rhythm: paced Axis: left Other:  Borderline qt prolongation, nonspecific st changes from previous, difficult to assess in the setting of paced rhythm__________  RADIOLOGY  I personally reviewed all radiographic images ordered to evaluate for the above acute complaints and reviewed radiology reports and findings.  These findings were personally discussed with the patient.  Please see medical record for radiology report.  ____________________________________________   PROCEDURES  Procedure(s) performed:  .Critical Care Performed by: Willy Eddyobinson, Daiki Dicostanzo, MD Authorized by: Willy Eddyobinson, Soha Thorup, MD   Critical care provider statement:    Critical care time (minutes):  41   Critical care time was exclusive of:  Separately billable procedures and treating other patients   Critical care was necessary to treat or prevent imminent or life-threatening deterioration of the following conditions:  Cardiac failure and toxidrome   Critical care was time spent personally by me on the following activities:  Development of treatment plan with patient or surrogate, discussions with consultants, evaluation of patient's response to treatment, examination of patient, obtaining history from patient or surrogate, ordering and performing treatments and interventions, ordering and review of laboratory studies, ordering and review of radiographic studies, pulse oximetry, re-evaluation of patient's condition and review of old charts      Critical Care performed: yes ____________________________________________  INITIAL IMPRESSION /  ASSESSMENT AND PLAN / ED COURSE  Pertinent labs & imaging results that were available during my care of the patient were reviewed by me and considered in my medical decision making (see chart for details).   DDX: ACS, pericarditis, esophagitis, boerhaaves, pe, dissection, pna, bronchitis, costochondritis   Mario Gallagher is a 57 y.o. who presents to the ED with extensive and complex medical condition and presentation.  Blood work will be sent for the above differential.  EKG does show some concerning concerning changes but not STEMI criteria.  Patient with history of noncompliance.  The patient will be placed on continuous pulse oximetry and telemetry for monitoring.  Laboratory evaluation will be sent to evaluate for the above complaints.     Clinical Course as of Jun 22 840  Sun Jun 22, 2018  1610 Troponin elevated to 8 but significant downtrending from 2 days ago.   [PR]  0801 Nitrite: NEGATIVE [PR]  0816 Patient again cocaine positive likely using in the past 2 days despite him saying he had not.   [PR]  [phone] Discussed the case with Dr. Lourena Simmonds cardiology.  Very complex patient very poor prognosis given his medical noncompliance but with real cardiac disease.  At this point will continue with heparinization and diuresis given his volume overload and acute respiratory failure with hypoxia.  Doubt PE or infectious process.  Patient also states he had not had any alcohol in 1 month but is positive this morning.  Will consult social work.  At this point I am not sure that we have much more to offer this patient.   [PR]    Clinical Course User Index [PR] Willy Eddy, MD     As part of my medical decision making, I reviewed the following data within the electronic MEDICAL RECORD NUMBER Nursing notes reviewed and incorporated, Labs reviewed, notes from prior ED visits and [place] Controlled Substance Database   ____________________________________________   FINAL CLINICAL IMPRESSION(S) / ED  DIAGNOSES  Final diagnoses:  Acute respiratory failure with hypoxia (HCC)  Elevated troponin  Acute on chronic congestive heart failure, unspecified heart failure type (HCC)      NEW MEDICATIONS STARTED DURING THIS VISIT:  New Prescriptions   No medications on file     Note:  This document was prepared using Dragon voice recognition software and may include unintentional dictation errors.    Willy Eddy, MD 06/22/18 [phone]

## 2018-06-22 NOTE — Plan of Care (Signed)

## 2018-06-22 NOTE — ED Triage Notes (Signed)
Pt arrives via [facility] c/o CP/SOB. Pt provides no description of CP, stating, "Cant you just let me sleep?" Pt states he hasnt taken any of his medications in approx 3 weeks. O2 sat 85% RA upon arrival placed on 2L, protocol initiated.

## 2018-06-22 NOTE — H&P (Signed)
[facility] [place] at [facility]   PATIENT NAME: Mario Gallagher    MR#:  [id number]  DATE OF BIRTH:  [birth year]  DATE OF ADMISSION:  06/22/2018  PRIMARY CARE PHYSICIAN: System, Pcp Not In   REQUESTING/REFERRING PHYSICIAN:   CHIEF COMPLAINT:   Chief Complaint  Patient presents with  . Chest Pain    HISTORY OF PRESENT ILLNESS: Mario Gallagher  is a 57 y.o. male with a known history of combined systolic and diastolic heart failure, coronary disease, type 2 diabetes mellitus, cocaine abuse, hypertension, hyperlipidemia presented to the medicine for chest pain.  Chest pain started this morning.  Patient was recently discharged on Friday from our hospital.  Patient has been noncompliant with his cardiac medications and actively abuses cocaine.  His troponin was around 8 today but peak troponin in the last admission was 49.  Case was discussed by ER physician with cardiology and started on IV heparin drip for anticoagulation.  Chest x-ray revealed pulmonary vascular congestion and pulmonary edema and he was given IV Lasix for diuresis in the emergency room.  Hospitalist service was consulted for further care.  PAST MEDICAL HISTORY:   Past Medical History:  Diagnosis Date  . Acute MI (HCC)   . CHF (congestive heart failure) (HCC)   . Diabetes mellitus without complication (HCC)   . H/O blood clots   . Hyperlipemia   . Hypertension   . Left kidney mass     PAST SURGICAL HISTORY:  Past Surgical History:  Procedure Laterality Date  . CARDIAC DEFIBRILLATOR PLACEMENT    . HERNIA REPAIR      SOCIAL HISTORY:  Social History   Tobacco Use  . Smoking status: Current Every Day Smoker    Packs/day: 0.50    Types: Cigarettes  . Smokeless tobacco: Never Used  Substance Use Topics  . Alcohol use: No    FAMILY HISTORY:  Family History  Problem Relation Age of Onset  . Heart disease Father   . Hypertension Brother   . Hyperlipidemia Brother   . Rheumatic fever  Mother   . Mitral valve prolapse Mother     DRUG ALLERGIES:  Allergies  Allergen Reactions  . Codeine Swelling  . Penicillins Swelling    Has patient had a PCN reaction causing immediate rash, facial/tongue/throat swelling, SOB or lightheadedness with hypotension: Yes Has patient had a PCN reaction causing severe rash involving mucus membranes or skin necrosis: No Has patient had a PCN reaction that required hospitalization: No Has patient had a PCN reaction occurring within the last 10 years: No If all of the above answers are "NO", then may proceed with Cephalosporin use.  . Tramadol Swelling    REVIEW OF SYSTEMS:   CONSTITUTIONAL: No fever, fatigue or weakness.  EYES: No blurred or double vision.  EARS, NOSE, AND THROAT: No tinnitus or ear pain.  RESPIRATORY: No cough, had shortness of breath, no wheezing or hemoptysis.  CARDIOVASCULAR: Has chest pain, orthopnea, edema.  GASTROINTESTINAL: No nausea, vomiting, diarrhea or abdominal pain.  GENITOURINARY: No dysuria, hematuria.  ENDOCRINE: No polyuria, nocturia,  HEMATOLOGY: No anemia, easy bruising or bleeding SKIN: No rash or lesion. MUSCULOSKELETAL: No joint pain or arthritis.   NEUROLOGIC: No tingling, numbness, weakness.  PSYCHIATRY: No anxiety or depression.   MEDICATIONS AT HOME:  Prior to Admission medications   Medication Sig Start Date End Date Taking? Authorizing Provider  amiodarone (PACERONE) 400 MG tablet Take 1 tablet (400 mg total) by mouth daily. Patient taking  differently: Take 200 mg by mouth daily.  05/21/18  Yes Mayo, Allyn Kenner, MD  apixaban (ELIQUIS) 5 MG TABS tablet Take 5 mg by mouth 2 (two) times daily.   Yes [provider]  aspirin 81 MG chewable tablet Chew 1 tablet (81 mg total) by mouth daily. 05/21/18  Yes Mayo, Allyn Kenner, MD  carvedilol (COREG) 3.125 MG tablet Take 4 tablets (12.5 mg total) by mouth 2 (two) times daily with a meal. 06/20/18  Yes Enedina Finner, MD  clonazePAM (KLONOPIN) 0.5  MG tablet Take 0.5 mg by mouth 2 (two) times daily as needed for anxiety.    Yes [provider]  furosemide (LASIX) 20 MG tablet Take 1 tablet (20 mg total) by mouth daily. 05/21/18  Yes Mayo, Allyn Kenner, MD  gabapentin (NEURONTIN) 800 MG tablet Take 800 mg by mouth 2 (two) times daily.    Yes [provider]  insulin NPH Human (HUMULIN N,NOVOLIN N) 100 UNIT/ML injection Inject 25 Units into the skin 2 (two) times daily.   Yes [provider]  insulin regular (NOVOLIN R,HUMULIN R) 100 units/mL injection Inject 2-8 Units into the skin 3 (three) times daily. Per sliding scale   Yes [provider]  isosorbide mononitrate (IMDUR) 30 MG 24 hr tablet Take 0.5 tablets (15 mg total) by mouth daily. 05/21/18  Yes Mayo, Allyn Kenner, MD  lisinopril (PRINIVIL,ZESTRIL) 5 MG tablet Take 1 tablet (5 mg total) by mouth daily. 05/21/18  Yes Mayo, Allyn Kenner, MD  metFORMIN (GLUCOPHAGE) 500 MG tablet Take 1 tablet (500 mg total) by mouth 2 (two) times daily with a meal. 05/20/18 06/22/18 Yes Mayo, Allyn Kenner, MD  rosuvastatin (CRESTOR) 20 MG tablet Take 1 tablet (20 mg total) by mouth daily at 6 PM. Patient taking differently: Take 20 mg by mouth at bedtime.  05/20/18  Yes Mayo, Allyn Kenner, MD  spironolactone (ALDACTONE) 25 MG tablet Take 0.5 tablets (12.5 mg total) by mouth daily. 05/20/18  Yes Mayo, Allyn Kenner, MD      PHYSICAL EXAMINATION:   VITAL SIGNS: Blood pressure 103/72, pulse 87, temperature 98 F (36.7 C), resp. rate 12, height 5\' 8"  (1.727 m), weight 58.9 kg, SpO2 95 %.  GENERAL:  57 y.o.-year-old patient lying in the bed with no acute distress.  EYES: Pupils equal, round, reactive to light and accommodation. No scleral icterus. Extraocular muscles intact.  HEENT: Head atraumatic, normocephalic. Oropharynx and nasopharynx clear.  NECK:  Supple, no jugular venous distention. No thyroid enlargement, no tenderness.  LUNGS: Decreased breath sounds bilaterally, bibasilar  crepitations heard. No use of accessory muscles of respiration.  CARDIOVASCULAR: S1, S2 normal. No murmurs, rubs, or gallops.  ABDOMEN: Soft, nontender, nondistended. Bowel sounds present. No organomegaly or mass.  EXTREMITIES: No pedal edema, cyanosis, or clubbing.  NEUROLOGIC: Cranial nerves II through XII are intact. Muscle strength 5/5 in all extremities. Sensation intact. Gait not checked.  PSYCHIATRIC: The patient is alert and oriented x 3.  SKIN: No obvious rash, lesion, or ulcer.   LABORATORY PANEL:   CBC Recent Labs  Lab 06/19/18 1232 06/20/18 0339 06/22/18 0721  WBC 19.1* 15.4* 11.4*  HGB 17.0 15.4 14.1  HCT 50.1 44.6 40.7  PLT 211 206 211  MCV 100.5* 100.4* 101.3*  MCH 34.2* 34.6* 35.1*  MCHC 34.0 34.5 34.7  RDW 14.5 14.6* 14.0  LYMPHSABS 1.1  --   --   MONOABS 1.1*  --   --   EOSABS 0.0  --   --  BASOSABS 0.1  --   --    ------------------------------------------------------------------------------------------------------------------  Chemistries  Recent Labs  Lab 06/19/18 1232 06/20/18 0339 06/22/18 0721  NA 137 135 129*  K 4.3 3.8 5.2*  CL 99 96* 89*  CO2 24 29 23   GLUCOSE 252* 100* 419*  BUN 18 27* 24*  CREATININE 0.85 0.94 1.12  CALCIUM 9.4 8.9 9.1  MG 1.7  --   --    ------------------------------------------------------------------------------------------------------------------ estimated creatinine clearance is 59.9 mL/min (by C-G formula based on SCr of 1.12 mg/dL). ------------------------------------------------------------------------------------------------------------------ Recent Labs    06/19/18 1637  TSH 0.302*     Coagulation profile Recent Labs  Lab 06/19/18 1232 06/22/18 0722  INR 0.98 1.02   ------------------------------------------------------------------------------------------------------------------- No results for input(s): DDIMER in the last 72  hours. -------------------------------------------------------------------------------------------------------------------  Cardiac Enzymes Recent Labs  Lab 06/20/18 0339 06/22/18 0721 06/22/18 1038  TROPONINI 49.31* 8.22* 8.40*   ------------------------------------------------------------------------------------------------------------------ Invalid input(s): POCBNP  ---------------------------------------------------------------------------------------------------------------  Urinalysis    Component Value Date/Time   COLORURINE YELLOW (A) 06/22/2018 0722   APPEARANCEUR CLEAR (A) 06/22/2018 0722   LABSPEC 1.027 06/22/2018 0722   PHURINE 5.0 06/22/2018 0722   GLUCOSEU >=500 (A) 06/22/2018 0722   HGBUR NEGATIVE 06/22/2018 0722   BILIRUBINUR NEGATIVE 06/22/2018 0722   KETONESUR 5 (A) 06/22/2018 0722   PROTEINUR NEGATIVE 06/22/2018 0722   NITRITE NEGATIVE 06/22/2018 0722   LEUKOCYTESUR NEGATIVE 06/22/2018 0722     RADIOLOGY: Dg Chest 2 View  Result Date: 06/22/2018 CLINICAL DATA:  Chest pain, shortness of breath EXAM: CHEST - 2 VIEW COMPARISON:  06/19/2018 FINDINGS: Increased interstitial markings/reticulonodular opacities bilaterally, lower lobe/infrahilar predominant. Given the mildly irregular appearance, atypical infection is favored over interstitial edema. Trace bilateral pleural effusions. No pneumothorax. The heart is normal in size.  Left subclavian ICD. IMPRESSION: Increased interstitial markings bilaterally, favoring atypical infection over interstitial edema. Trace bilateral pleural effusions. Overall appearance has progressed from the prior. Electronically Signed   By: Charline Bills M.D.   On: 06/22/2018 08:15    EKG: Orders placed or performed during the hospital encounter of 06/22/18  . ED EKG within 10 minutes  . ED EKG within 10 minutes  . EKG 12-Lead  . EKG 12-Lead    IMPRESSION AND PLAN:  57 year old male patient with history of combined systolic  diastolic heart failure, coronary disease, type 2 diabetes mellitus, AICD, cocaine abuse presented to the emergency room with chest pain  -Chest pain Noncompliant with cardiac medications Cycle troponin Continue aspirin, beta-blocker, Crestor PRN nitrates for chest pain IV heparin drip for anticoagulation Appreciate cardiology evaluation  -Combined systolic and diastolic heart failure with EF of 15 to 20% History of left ventricular thrombus Noncompliant with medications Continue Lasix, beta-blocker, isosorbide dinitrate and lisinopril  -Coronary artery disease Medical management Not a candidate for invasive CABG considering the noncompliance and continued cocaine use Had been evaluated for CABG at [facility] and also at [facility] [place]  -History of ventricular tachycardia and ventricular 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 Management plans discussed with the patient, family and they are in agreement.  CODE STATUS: Full code    Code Status Orders  (From admission, onward)         Start     Ordered   06/22/18 1021  Full code  Continuous     06/22/18 1020        Code Status  History    Date Active Date Inactive Code Status Order ID Comments User Context   06/19/2018 1450 06/20/2018 1740 Full Code [id number]  Campbell StallMayo, Katy Dodd, MD ED   05/19/2018 2120 05/20/2018 1519 Full Code [id number]  Elder Neguselos Reyes, Natasha, RN Inpatient       TOTAL TIME TAKING CARE OF THIS PATIENT: 51 minutes.    Ihor AustinPavan Jaimon Bugaj M.D on 06/22/2018 at 1:33 PM  Between 7am to 6pm - Pager - [phone]  After 6pm go to [email or web address] - password [email or web address]  Fabio Ne[facility]  Office  [phone]  CC: Primary care physician; System, Pcp Not In
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
## 2018-06-22 NOTE — Progress Notes (Signed)
Advanced care plan.  Purpose of the Encounter: CODE STATUS  Parties in Attendance: Patient  Patient's Decision Capacity: Good  Subjective/Patient's story: Presented to emergency room for chest pain   Objective/Medical story Patient has extensive cardiac history along with substance abuse He has a history of combined systolic and diastolic heart failure with EF of 15 to 20% Has coronary artery disease Needs evaluation   Goals of care determination:  Advance care directives goals of care discussed with the patient Patient wants everything done which includes CPR, intubation and ventilator if the need arises   CODE STATUS: Full code   Time spent discussing advanced care planning: 16 minutes

## 2018-06-22 NOTE — Consult Note (Signed)
Cardiology Consultation:   Patient ID: Mario Gallagher MRN: [id number]; DOB: [birth year]  Admit date: 06/22/2018 Date of Consult: 06/22/2018  Primary Care Provider: System, Pcp Not In Primary Cardiologist:  Arida  Primary Electrophysiologist:  None    Patient Profile:   Mario Gallagher is a 57 y.o. 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 Tobi Bastos .  History of Present Illness:   Mario Gallagher has a long history of polysubstance abuse including cocaine, cigarettes and alcohol.  He has severe coronary artery disease and has a known chronic total occlusion of his left anterior descending artery and right coronary artery.  The left circumflex artery has a mid 80% stenosis.  Is been evaluated at [facility] for possible CABG but this was delayed to due to his noncompliance with his substance abuse.  He was transferred to [facility] [place] for evaluation for CABG.  He recently presented to [facility] with multiple ICD shocks.  He was transferred to [facility] for further evaluation.   He was recently [facility] to [facility] hollowing ICD shock.  Troponin levels were mildly elevated.  His UDS was positive for cocaine.  He has a known mural thrombus.  He is been noncompliant with his medications.  He is homeless.  Given his noncompliance and lack of follow-up, warfarin is not a good option.  Continues to have chest pain.  He also has a headache.  He denies any nausea or vomiting. Denies any fever.  Past Medical History:  Diagnosis Date  . Acute MI (HCC)   . CHF (congestive heart failure) (HCC)   . Diabetes mellitus without complication  (HCC)   . H/O blood clots   . Hyperlipemia   . Hypertension   . Left kidney mass     Past Surgical History:  Procedure Laterality Date  . CARDIAC DEFIBRILLATOR PLACEMENT    . HERNIA REPAIR       Home Medications:  Prior to Admission medications   Medication Sig Start Date End Date Taking? Authorizing Provider  amiodarone (PACERONE) 400 MG tablet Take 1 tablet (400 mg total) by mouth daily. Patient taking differently: Take 200 mg by mouth daily.  05/21/18  Yes Mayo, Allyn Kenner, MD  apixaban (ELIQUIS) 5 MG TABS tablet Take 5 mg by mouth 2 (two) times daily.   Yes [provider]  aspirin 81 MG chewable tablet Chew 1 tablet (81 mg total) by mouth daily. 05/21/18  Yes Mayo, Allyn Kenner, MD  carvedilol (COREG) 3.125 MG tablet Take 4 tablets (12.5 mg total) by mouth 2 (two) times daily with a meal. 06/20/18  Yes Enedina Finner, MD  clonazePAM (KLONOPIN) 0.5 MG tablet Take 0.5 mg by mouth 2 (two) times daily as needed for anxiety.    Yes [provider]  furosemide (LASIX) 20 MG tablet Take 1 tablet (20 mg total) by mouth daily. 05/21/18  Yes Mayo, Allyn Kenner, MD  gabapentin (NEURONTIN) 800 MG tablet Take 800 mg by mouth 2 (two) times daily.    Yes [provider]  insulin NPH Human (HUMULIN N,NOVOLIN N) 100 UNIT/ML injection Inject 25 Units into the skin 2 (two) times daily.   Yes [provider]  insulin regular (NOVOLIN R,HUMULIN R) 100 units/mL injection Inject 2-8 Units into the skin 3 (three) times daily. Per sliding scale   Yes [provider]  isosorbide mononitrate (IMDUR) 30 MG 24 hr tablet Take 0.5 tablets (15 mg total) by mouth daily. 05/21/18  Yes Mayo, Allyn KennerKaty Dodd, MD  lisinopril (PRINIVIL,ZESTRIL) 5 MG tablet Take 1 tablet (5 mg total) by mouth daily. 05/21/18  Yes Mayo, Allyn KennerKaty Dodd, MD  metFORMIN (GLUCOPHAGE) 500 MG tablet Take 1 tablet (500 mg total) by mouth 2 (two) times daily with a meal. 05/20/18 06/22/18 Yes Mayo, Allyn KennerKaty Dodd, MD  rosuvastatin  (CRESTOR) 20 MG tablet Take 1 tablet (20 mg total) by mouth daily at 6 PM. Patient taking differently: Take 20 mg by mouth at bedtime.  05/20/18  Yes Mayo, Allyn KennerKaty Dodd, MD  spironolactone (ALDACTONE) 25 MG tablet Take 0.5 tablets (12.5 mg total) by mouth daily. 05/20/18  Yes Mayo, Allyn KennerKaty Dodd, MD    Inpatient Medications: Scheduled Meds: . [START ON 06/23/2018] amiodarone  400 mg Oral Daily  . [START ON 06/23/2018] aspirin EC  81 mg Oral Daily  . carvedilol  12.5 mg Oral BID WC  . clopidogrel  75 mg Oral Daily  . furosemide  20 mg Oral Daily  . gabapentin  800 mg Oral BID  . isosorbide mononitrate  15 mg Oral Daily  . lisinopril  5 mg Oral Daily  . metFORMIN  500 mg Oral BID WC  . rosuvastatin  20 mg Oral q1800  . sodium chloride flush  3 mL Intravenous Q12H  . spironolactone  12.5 mg Oral Daily   Continuous Infusions: . sodium chloride    . heparin 650 Units/hr (06/22/18 0851)   PRN Meds: sodium chloride, acetaminophen, nitroGLYCERIN, ondansetron (ZOFRAN) IV, sodium chloride flush  Allergies:    Allergies  Allergen Reactions  . Codeine Swelling  . Penicillins Swelling    Has patient had a PCN reaction causing immediate rash, facial/tongue/throat swelling, SOB or lightheadedness with hypotension: Yes Has patient had a PCN reaction causing severe rash involving mucus membranes or skin necrosis: No Has patient had a PCN reaction that required hospitalization: No Has patient had a PCN reaction occurring within the last 10 years: No If all of the above answers are "NO", then may proceed with Cephalosporin use.  . Tramadol Swelling    Social History:   Social History   Socioeconomic History  . Marital status: Single    Spouse name: Not on file  . Number of children: Not on file  . Years of education: Not on file  . Highest education level: Not on file  Occupational History  . Not on file  Social Needs  . Financial resource strain: Not on file  . Food insecurity:    Worry: Not  on file    Inability: Not on file  . Transportation needs:    Medical: Not on file    Non-medical: Not on file  Tobacco Use  . Smoking status: Current Every Day Smoker    Packs/day: 0.50    Types: Cigarettes  . Smokeless tobacco: Never Used  Substance and Sexual Activity  . Alcohol use: No  . Drug use: No  . Sexual activity: Not on file  Lifestyle  . Physical activity:    Days per week: Not on file    Minutes per session: Not on file  . Stress: Not on file  Relationships  . Social connections:    Talks on phone: Not on file  Gets together: Not on file    Attends religious service: Not on file    Active member of club or organization: Not on file    Attends meetings of clubs or organizations: Not on file    Relationship status: Not on file  . Intimate partner violence:    Fear of current or ex partner: Not on file    Emotionally abused: Not on file    Physically abused: Not on file    Forced sexual activity: Not on file  Other Topics Concern  . Not on file  Social History Narrative  . Not on file    Family History:    Family History  Problem Relation Age of Onset  . Heart disease Father   . Hypertension Brother   . Hyperlipidemia Brother   . Rheumatic fever Mother   . Mitral valve prolapse Mother      ROS:  Please see the history of present illness.   All other ROS reviewed and negative.     Physical Exam/Data:   Vitals:   06/22/18 0754 06/22/18 0820 06/22/18 1029 06/22/18 1115  BP:   103/72 103/72  Pulse:   86 87  Resp:   12   Temp:  98.7 F (37.1 C) 98 F (36.7 C)   SpO2:   95%   Weight: 54.6 kg  58.9 kg   Height: 5\' 8"  (1.727 m)  5\' 8"  (1.727 m)     Intake/Output Summary (Last 24 hours) at 06/22/2018 1153 Last data filed at 06/22/2018 1117 Gross per 24 hour  Intake -  Output 250 ml  Net -250 ml   Filed Weights   06/22/18 0754 06/22/18 1029  Weight: 54.6 kg 58.9 kg   Body mass index is 19.75 kg/m.  General: Lace gentleman, resting in  bed.  Does not appear in any distress.  Multiple tattoos HEENT: normal Lymph: no adenopathy Neck: no JVD Endocrine:  No thryomegaly Vascular: No carotid bruits; FA pulses 2+ bilaterally without bruits  Cardiac: Rate S1-S2.  Positive S3.  Positive left ventricular heave Lungs:  clear to auscultation bilaterally, no wheezing, rhonchi or rales  Abd: soft, nontender, no hepatomegaly  Ext: no edema Musculoskeletal:  No deformities, BUE and BLE strength normal and equal Skin: warm and dry  Neuro:  CNs 2-12 intact, no focal abnormalities noted Psych:  Normal affect   EKG: Atrial sensing and ventricular pacing at 98 beats minute. Telemetry:    Relevant CV Studies:   Laboratory Data:  Chemistry Recent Labs  Lab 06/19/18 1232 06/20/18 0339 06/22/18 0721  NA 137 135 129*  K 4.3 3.8 5.2*  CL 99 96* 89*  CO2 24 29 23   GLUCOSE 252* 100* 419*  BUN 18 27* 24*  CREATININE 0.85 0.94 1.12  CALCIUM 9.4 8.9 9.1  GFRNONAA >60 >60 >60  GFRAA >60 >60 >60  ANIONGAP 14 10 17*    No results for input(s): PROT, ALBUMIN, AST, ALT, ALKPHOS, BILITOT in the last 168 hours. Hematology Recent Labs  Lab 06/19/18 1232 06/20/18 0339 06/22/18 0721  WBC 19.1* 15.4* 11.4*  RBC 4.98 4.44 4.02*  HGB 17.0 15.4 14.1  HCT 50.1 44.6 40.7  MCV 100.5* 100.4* 101.3*  MCH 34.2* 34.6* 35.1*  MCHC 34.0 34.5 34.7  RDW 14.5 14.6* 14.0  PLT 211 206 211   Cardiac Enzymes Recent Labs  Lab 06/19/18 1232 06/19/18 1637 06/19/18 2035 06/20/18 0339 06/22/18 0721 06/22/18 1038  TROPONINI 27.96* 34.00* 31.29* 49.31* 8.22* 8.40*   No  results for input(s): TROPIPOC in the last 168 hours.  BNP Recent Labs  Lab 06/19/18 1232  BNP 1,159.0*    DDimer No results for input(s): DDIMER in the last 168 hours.  Radiology/Studies:  Dg Chest 2 View  Result Date: 06/22/2018 CLINICAL DATA:  Chest pain, shortness of breath EXAM: CHEST - 2 VIEW COMPARISON:  06/19/2018 FINDINGS: Increased interstitial  markings/reticulonodular opacities bilaterally, lower lobe/infrahilar predominant. Given the mildly irregular appearance, atypical infection is favored over interstitial edema. Trace bilateral pleural effusions. No pneumothorax. The heart is normal in size.  Left subclavian ICD. IMPRESSION: Increased interstitial markings bilaterally, favoring atypical infection over interstitial edema. Trace bilateral pleural effusions. Overall appearance has progressed from the prior. Electronically Signed   By: Charline Bills M.D.   On: 06/22/2018 08:15   Dg Chest 2 View  Result Date: 06/19/2018 CLINICAL DATA:  Onset of chest pain last night radiating to the arms as well as jaw all. Associated shortness of breath. History of coronary artery disease with stent placement. History of CHF, previous MI, current smoker. EXAM: CHEST - 2 VIEW COMPARISON:  PA chest x-ray dated May 19, 2018 FINDINGS: The lungs are well-expanded. The interstitial markings are increased compared to the previous study. The heart is normal in size. The pulmonary vascularity is mildly prominent centrally but stable. The ICD is in stable position. There is no pleural effusion or pneumothorax. The observed bony thorax is unremarkable. IMPRESSION: COPD. Bilateral interstitial prominence may reflect low-grade CHF of cardiac or noncardiac cause. No definite cardiomegaly. No alveolar pneumonia. Electronically Signed   By: David  [place]az[place]and M.D.   On: 06/19/2018 12:53    Assessment and Plan:   1.  Coronary artery disease: The patient presents with episodes of chest discomfort.  He has mildly elevated troponin levels but these are quite likely continuation of troponin elevation from his.  Previous admission several days ago.  The peak troponin at that time was 49.  He started having chest pain this morning while he was at rest.  His EKG shows atrial sensing and ventricular pacing. Urine drug screen is positive for cocaine again.  Is currently on  heparin.  At this point I do not think that we have much more to add.  Been evaluated for coronary artery bypass grafting at [facility] and also at [facility].  He is not currently a candidate for any further invasive cardiac procedures because of his noncompliance and is continued cocaine abuse. Medications include carvedilol, isosorbide, rosuvastatin.  It does not appear that he is taking any of these medications.  2.  Chronic combined systolic and diastolic congestive heart failure: Patient has known ejection fraction of 15 to 20%.  He has an S3 gallop on exam and has a left ventricular heave.  He also has a left ventricular thrombus.  He is not been compliant with his medications. His home medications include carvedilol, Lasix, isosorbide and lisinopril.  It does not appear that he is taking any of these medications.  3Ventricular tachycardia/ventricular fibrillation: Patient has an ICD in place.  He has had several admissions for repeated ICD shocks.  His home medications include amiodarone 200 mg a day.  It does not appear that he is taking this medication.       For questions or updates, please contact [facility] Please consult [email or web address] for contact info under     Signed, Kristeen Miss, MD  06/22/2018 11:53 AM
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
## 2018-06-23 LAB — CBC
HCT: 38 % — ABNORMAL LOW (ref 40.0–52.0)
Hemoglobin: 13.2 g/dL (ref 13.0–18.0)
MCH: 34.5 pg — AB (ref 26.0–34.0)
MCHC: 34.8 g/dL (ref 32.0–36.0)
MCV: 98.9 fL (ref 80.0–100.0)
PLATELETS: 207 10*3/uL (ref 150–440)
RBC: 3.84 MIL/uL — ABNORMAL LOW (ref 4.40–5.90)
RDW: 14.1 % (ref 11.5–14.5)
WBC: 14.3 10*3/uL — ABNORMAL HIGH (ref 3.8–10.6)

## 2018-06-23 LAB — GLUCOSE, CAPILLARY
GLUCOSE-CAPILLARY: 547 mg/dL — AB (ref 70–99)
Glucose-Capillary: 314 mg/dL — ABNORMAL HIGH (ref 70–99)
Glucose-Capillary: 186 mg/dL — ABNORMAL HIGH (ref 70–99)
Glucose-Capillary: 336 mg/dL — ABNORMAL HIGH (ref 70–99)
Glucose-Capillary: 543 mg/dL (ref 70–99)

## 2018-06-23 LAB — HEPARIN LEVEL (UNFRACTIONATED)
Heparin Unfractionated: <0.1 IU/mL — ABNORMAL LOW (ref 0.30–0.70)
Heparin Unfractionated: <0.1 IU/mL — ABNORMAL LOW (ref 0.30–0.70)

## 2018-06-23 MED ORDER — INSULIN ASPART 100 UNIT/ML ~~LOC~~ SOLN
16.0000 [IU] | Freq: Once | SUBCUTANEOUS | Status: AC
  Administered 2018-06-23: 16 [IU] via SUBCUTANEOUS
  Filled 2018-06-23: qty 1

## 2018-06-23 MED ORDER — LEVOFLOXACIN 500 MG PO TABS: 500.0000 mg | ORAL_TABLET | Freq: Every day | ORAL | 0 refills | Status: AC

## 2018-06-23 MED ORDER — OXYCODONE-ACETAMINOPHEN 10-325 MG PO TABS: 1.0000 | ORAL_TABLET | Freq: Four times a day (QID) | ORAL | 0 refills | Status: AC | PRN

## 2018-06-23 MED ORDER — LEVOFLOXACIN 500 MG PO TABS
500.0000 mg | ORAL_TABLET | Freq: Every day | ORAL | Status: DC
  Administered 2018-06-23: 500 mg via ORAL
  Filled 2018-06-23: qty 1

## 2018-06-23 MED ORDER — INSULIN ASPART 100 UNIT/ML ~~LOC~~ SOLN
0.0000 [IU] | Freq: Three times a day (TID) | SUBCUTANEOUS | Status: DC
  Administered 2018-06-23: 7 [IU] via SUBCUTANEOUS
  Filled 2018-06-23: qty 1

## 2018-06-23 MED ORDER — ALBUTEROL SULFATE HFA 108 (90 BASE) MCG/ACT IN AERS: 2.0000 | INHALATION_SPRAY | Freq: Four times a day (QID) | RESPIRATORY_TRACT | 2 refills | Status: AC | PRN

## 2018-06-23 MED ORDER — INSULIN REGULAR HUMAN 100 UNIT/ML IJ SOLN
10.0000 [IU] | Freq: Once | INTRAMUSCULAR | Status: AC
  Administered 2018-06-23: 10 [IU] via INTRAVENOUS
  Filled 2018-06-23: qty 10

## 2018-06-23 MED ORDER — HEPARIN BOLUS VIA INFUSION
1800.0000 [IU] | Freq: Once | INTRAVENOUS | Status: AC
  Administered 2018-06-23: 1800 [IU] via INTRAVENOUS
  Filled 2018-06-23: qty 1800

## 2018-06-23 NOTE — Progress Notes (Signed)
Pt being discharged to home. AVS reviewed with patient, no questions or concerns at this time. Pt had bus passes from social work and stated he was going to get on the bus to take him to a shelter.
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
## 2018-06-23 NOTE — Clinical Social Work Note (Signed)
CSW provided patient with bus pass for [place]. Patient states that [facility] is going [facility]. CSW signing off. Please re consult if further needs arise.   Ruthe Mannanandace Toria Monte MSW, [address]CSWA [phone]

## 2018-06-23 NOTE — Discharge Summary (Signed)
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 Acute MI (HCC)   . CHF (congestive heart failure) (HCC)   . Diabetes mellitus without complication (HCC)   . H/O blood clots   . Hyperlipemia   . Hypertension   . Left kidney mass      ADMITTING HISTORY Mario Gallagher  is a 57 y.o. male with a known history of combined systolic and diastolic heart failure, coronary disease, type 2 diabetes mellitus, cocaine abuse, hypertension, hyperlipidemia presented to the medicine for chest pain.  Chest pain started this morning.  Patient was recently discharged on Friday from our hospital.  Patient has been noncompliant with his cardiac medications and actively abuses cocaine.  His troponin was around 8 today but peak troponin in the last admission was 49.  Case was discussed by ER physician with cardiology and started on IV heparin drip for anticoagulation.  Chest x-ray revealed pulmonary vascular congestion and pulmonary edema and he was given IV Lasix for diuresis in the emergency room.  Hospitalist service was consulted for  further care.   HOSPITAL COURSE:  Patient was admitted to telemetry.  He was diuresed with Lasix for pulmonary edema.  Troponin was around 8 and was cycle.  Cardiology evaluation was done with [facility] health cardiology.  Patient was anticoagulated with IV heparin drip during the hospitalization. No acute cardiac intervention recommended by cardiology.  Medical management recommended to continue for coronary artery disease, combined systolic diastolic heart failure.  Patient has been again counseled to stop smoking and stop using cocaine and substance abuse counseling was given.  Patient will be discharged home with outpatient follow-up with cardiology.  No acute intervention recommended by cardiology in this hospitalization.  Shortness of breath improved patient has been weaned off oxygen and down to room air.  His chest pain also completely resolved.  CONSULTS OBTAINED:  Treatment Team:  Nahser, Deloris Ping, MD  DRUG ALLERGIES:   Allergies  Allergen Reactions  . Codeine Swelling  . Penicillins Swelling    Has patient had a PCN reaction causing immediate rash, facial/tongue/throat swelling, SOB or lightheadedness with hypotension: Yes Has patient had a PCN reaction causing severe rash involving mucus membranes or skin necrosis: No Has patient had a PCN reaction that required hospitalization: No Has patient had a PCN reaction occurring within the last 10 years: No If all of the above answers are "NO", then may proceed with Cephalosporin use.  . Tramadol Swelling    DISCHARGE MEDICATIONS:   Allergies as of 06/23/2018      Reactions   Codeine Swelling   Penicillins Swelling   Has patient had a PCN reaction causing immediate rash, facial/tongue/throat  swelling, SOB or lightheadedness with hypotension: Yes Has patient had a PCN reaction causing severe rash involving mucus membranes or skin necrosis: No Has patient had a PCN reaction that required hospitalization: No Has patient had a PCN  reaction occurring within the last 10 years: No If all of the above answers are "NO", then may proceed with Cephalosporin use.   Tramadol Swelling      Medication List    TAKE these medications   albuterol 108 (90 Base) MCG/ACT inhaler Commonly known as:  PROVENTIL HFA;VENTOLIN HFA Inhale 2 puffs into the lungs every 6 (six) hours as needed for wheezing or shortness of breath.   amiodarone 400 MG tablet Commonly known as:  PACERONE Take 1 tablet (400 mg total) by mouth daily. What changed:  how much to take   apixaban 5 MG Tabs tablet Commonly known as:  ELIQUIS Take 5 mg by mouth 2 (two) times daily.   aspirin 81 MG chewable tablet Chew 1 tablet (81 mg total) by mouth daily.   carvedilol 3.125 MG tablet Commonly known as:  COREG Take 4 tablets (12.5 mg total) by mouth 2 (two) times daily with a meal.   clonazePAM 0.5 MG tablet Commonly known as:  KLONOPIN Take 0.5 mg by mouth 2 (two) times daily as needed for anxiety.   furosemide 20 MG tablet Commonly known as:  LASIX Take 1 tablet (20 mg total) by mouth daily.   gabapentin 800 MG tablet Commonly known as:  NEURONTIN Take 800 mg by mouth 2 (two) times daily.   insulin NPH Human 100 UNIT/ML injection Commonly known as:  HUMULIN N,NOVOLIN N Inject 25 Units into the skin 2 (two) times daily.   insulin regular 100 units/mL injection Commonly known as:  NOVOLIN R,HUMULIN R Inject 2-8 Units into the skin 3 (three) times daily. Per sliding scale   isosorbide mononitrate 30 MG 24 hr tablet Commonly known as:  IMDUR Take 0.5 tablets (15 mg total) by mouth daily.   levofloxacin 500 MG tablet Commonly known as:  LEVAQUIN Take 1 tablet (500 mg total) by mouth daily for 5 days.   lisinopril 5 MG tablet Commonly known as:  PRINIVIL,ZESTRIL Take 1 tablet (5 mg total) by mouth daily.   metFORMIN 500 MG tablet Commonly known as:  GLUCOPHAGE Take 1 tablet (500 mg total) by mouth 2 (two) times daily with a meal.    oxyCODONE-acetaminophen 10-325 MG tablet Commonly known as:  PERCOCET Take 1 tablet by mouth every 6 (six) hours as needed for pain.   rosuvastatin 20 MG tablet Commonly known as:  CRESTOR Take 1 tablet (20 mg total) by mouth daily at 6 PM. What changed:  when to take this   spironolactone 25 MG tablet Commonly known as:  ALDACTONE Take 0.5 tablets (12.5 mg total) by mouth daily.       Today  Patient seen today No shortness of breath No chest pain Feels better Weaned off oxygen  VITAL SIGNS:  Blood pressure 107/73, pulse 79, temperature 98.9 F (37.2 C), temperature source Oral, resp. rate 18, height 5\' 8"  (1.727 m), weight 58.9 kg, SpO2 92 %.  I/O:    Intake/Output Summary (Last 24 hours) at 06/23/2018 1559 Last data filed at 06/23/2018 1336 Gross per 24 hour  Intake 560.69 ml  Output 2500 ml  Net -1939.31 ml    PHYSICAL EXAMINATION:  Physical Exam  GENERAL:  57 y.o.-year-old patient lying in the bed with no acute distress.  LUNGS: Normal breath sounds  bilaterally, no wheezing, rales,rhonchi or crepitation. No use of accessory muscles of respiration.  CARDIOVASCULAR: S1, S2 normal. No murmurs, rubs, or gallops.  ABDOMEN: Soft, non-tender, non-distended. Bowel sounds present. No organomegaly or mass.  NEUROLOGIC: Moves all 4 extremities. PSYCHIATRIC: The patient is alert and oriented x 3.  SKIN: No obvious rash, lesion, or ulcer.   DATA REVIEW:   CBC Recent Labs  Lab 06/23/18 0716  WBC 14.3*  HGB 13.2  HCT 38.0*  PLT 207    Chemistries  Recent Labs  Lab 06/19/18 1232  06/22/18 2304  NA 137   < > 126*  K 4.3   < > 4.6  CL 99   < > 88*  CO2 24   < > 29  GLUCOSE 252*   < > 520*  BUN 18   < > 34*  CREATININE 0.85   < > 0.97  CALCIUM 9.4   < > 8.8*  MG 1.7  --   --    < > = values in this interval not displayed.    Cardiac Enzymes Recent Labs  Lab 06/22/18 2304  TROPONINI 8.45*    Microbiology Results  No results found for this or any  previous visit.  RADIOLOGY:  Dg Chest 2 View  Result Date: 06/22/2018 CLINICAL DATA:  Chest pain, shortness of breath EXAM: CHEST - 2 VIEW COMPARISON:  06/19/2018 FINDINGS: Increased interstitial markings/reticulonodular opacities bilaterally, lower lobe/infrahilar predominant. Given the mildly irregular appearance, atypical infection is favored over interstitial edema. Trace bilateral pleural effusions. No pneumothorax. The heart is normal in size.  Left subclavian ICD. IMPRESSION: Increased interstitial markings bilaterally, favoring atypical infection over interstitial edema. Trace bilateral pleural effusions. Overall appearance has progressed from the prior. Electronically Signed   By: Charline Bills M.D.   On: 06/22/2018 08:15    Follow up with PCP in 1 week.  Management plans discussed with the patient, family and they are in agreement.  CODE STATUS: Full code    Code Status Orders  (From admission, onward)         Start     Ordered   06/22/18 1021  Full code  Continuous     06/22/18 1020        Code Status History    Date Active Date Inactive Code Status Order ID Comments User Context   06/19/2018 1450 06/20/2018 1740 Full Code [id number]  Campbell Stall, MD ED   05/19/2018 2120 05/20/2018 1519 Full Code [id number]  Elder Negus, RN Inpatient      TOTAL TIME TAKING CARE OF THIS PATIENT ON DAY OF DISCHARGE: more than 34 minutes.   Ihor Austin M.D on 06/23/2018 at 3:59 PM  Between 7am to 6pm - Pager - [phone]  After 6pm go to [email or web address] - password [email or web address]  [facility]  Office  [phone]  CC: Primary care physician; System, Pcp Not In  Note: This dictation was prepared with Dragon dictation along with smaller phrase technology. Any transcriptional errors that result from this process are unintentional.

## 2018-06-23 NOTE — Progress Notes (Addendum)
Spoke with Dr. Tobi BastosPyreddy reference CBG of 547 mg/dL. Verbal orders for 16 units one time given and re-check CBG in one hour. Verbal order also given to administer pt's afternoon dose of metformin now.

## 2018-06-23 NOTE — Progress Notes (Signed)
LCSW met with patient and provided him a financial assistance application form and intake contact information for [facility] in [place]. Patient stated he needs a  Local bus ticket. LCSW stated that another Rn/ SW could provide that for him. Patient is going to [employer].  Mario Bigos LCSW

## 2018-06-23 NOTE — Progress Notes (Signed)
Progress Note  Patient Name: Mario Gallagher Date of Encounter: 06/23/2018  Primary Cardiologist: [facility]  Subjective   He continues to have retrosternal chest pain and also reports abdominal pain.  He says that he needs pain medication in order to keep him off of cocaine and out of the hospital.    Inpatient Medications    Scheduled Meds: . amiodarone  400 mg Oral Daily  . aspirin EC  81 mg Oral Daily  . carvedilol  12.5 mg Oral BID WC  . clopidogrel  75 mg Oral Daily  . furosemide  20 mg Oral Daily  . gabapentin  800 mg Oral BID  . insulin aspart  0-9 Units Subcutaneous TID AC & HS  . isosorbide mononitrate  15 mg Oral Daily  . lisinopril  5 mg Oral Daily  . metFORMIN  500 mg Oral BID WC  . rosuvastatin  20 mg Oral q1800  . sodium chloride flush  3 mL Intravenous Q12H  . spironolactone  12.5 mg Oral Daily   Continuous Infusions: . sodium chloride    . heparin 1,100 Units/hr (06/23/18 0229)   PRN Meds: sodium chloride, acetaminophen, morphine injection, nitroGLYCERIN, ondansetron (ZOFRAN) IV, sodium chloride flush   Vital Signs    Vitals:   06/22/18 1731 06/22/18 1952 06/23/18 0624 06/23/18 0804  BP: (!) 94/59 104/62 110/74 107/73  Pulse: 80 81 84 79  Resp: 16 18 18    Temp: 98.6 F (37 C) 99 F (37.2 C) 98.4 F (36.9 C) 98.9 F (37.2 C)  TempSrc: Oral Oral Oral Oral  SpO2: 95% 94% 92% 92%  Weight:      Height:        Intake/Output Summary (Last 24 hours) at 06/23/2018 0907 Last data filed at 06/23/2018 0807 Gross per 24 hour  Intake 320.69 ml  Output 1700 ml  Net -1379.31 ml   Filed Weights   06/22/18 0754 06/22/18 1029  Weight: 54.6 kg 58.9 kg    Physical Exam   GEN: thin, in no acute distress.  HEENT: Grossly normal.  Neck: Supple, no JVD, carotid bruits, or masses. Cardiac: RRR, distant, no murmurs, + s3. No clubbing, cyanosis, edema.  Radials/DP/PT 2+ and equal bilaterally. Chest wall is tender to minimal touch. Respiratory:  Respirations regular  and unlabored, bibasilar crackles. GI: Soft, nontender, nondistended, BS + x 4. MS: no deformity or atrophy. Skin: warm and dry, no rash. Neuro:  Strength and sensation are intact. Psych: AAOx3.  Normal affect.  Labs    Chemistry Recent Labs  Lab 06/20/18 0339 06/22/18 0721 06/22/18 2304  NA 135 129* 126*  K 3.8 5.2* 4.6  CL 96* 89* 88*  CO2 29 23 29   GLUCOSE 100* 419* 520*  BUN 27* 24* 34*  CREATININE 0.94 1.12 0.97  CALCIUM 8.9 9.1 8.8*  GFRNONAA >60 >60 >60  GFRAA >60 >60 >60  ANIONGAP 10 17* 9     Hematology Recent Labs  Lab 06/22/18 0721 06/22/18 2304 06/23/18 0716  WBC 11.4* 11.5* 14.3*  RBC 4.02* 3.79* 3.84*  HGB 14.1 13.2 13.2  HCT 40.7 37.8* 38.0*  MCV 101.3* 99.6 98.9  MCH 35.1* 34.9* 34.5*  MCHC 34.7 35.0 34.8  RDW 14.0 13.9 14.1  PLT 211 223 207    Cardiac Enzymes Recent Labs  Lab 06/22/18 0721 06/22/18 1038 06/22/18 1501 06/22/18 2304  TROPONINI 8.22* 8.40* 8.56* 8.45*     BNP Recent Labs  Lab 06/19/18 1232  BNP 1,159.0*     Radiology  Dg Chest 2 View  Result Date: 06/22/2018 CLINICAL DATA:  Chest pain, shortness of breath EXAM: CHEST - 2 VIEW COMPARISON:  06/19/2018 FINDINGS: Increased interstitial markings/reticulonodular opacities bilaterally, lower lobe/infrahilar predominant. Given the mildly irregular appearance, atypical infection is favored over interstitial edema. Trace bilateral pleural effusions. No pneumothorax. The heart is normal in size.  Left subclavian ICD. IMPRESSION: Increased interstitial markings bilaterally, favoring atypical infection over interstitial edema. Trace bilateral pleural effusions. Overall appearance has progressed from the prior. Electronically Signed   By: Charline Bills M.D.   On: 06/22/2018 08:15    Telemetry    A sensed, V paced - Personally Reviewed  Patient Profile     57 y.o. male with known history of coronary artery disease status post PCI to the LAD and left circumflex with  recent cardiac catheterization in July showing chronically occluded LAD and RCA with collaterals, ischemic cardiomyopathy with an EF of 15 to 20% status post biventricular ICD placement, recurrent ventricular tachycardia, mural thrombus, homelessness and multidrug abuse including cocaine, tobacco and alcohol.    Assessment & Plan    1.  Chronic chest pain:  Pt presents with ongoing chest pain that is worse with palpation, deep breathing, and position changes.  He says that he only uses cocaine b/c he cannot get pain medication.  He recognizes that he needs to get enrolled in a pain mgmt/drug rehab clinic but feels he has limited resources and in the meantime feels that we should be Rx unlimited pain meds.  I will ask SW to see.  2.  CAD:  S/p recent NSTEMI.  Known chronic total occlusions of the LAD and RCA w/ 80% LCX stenosis by cath @ [facility] in July.  Nuc study in August showed large area of infarct w/o ischemia.  NSTEMI last week was in the setting of cocaine use and he was conservatively managed.  He has not been compliant with meds b/c he says he cannot afford them. Says he hasn't used cocaine since last week but continues to have chest wall pain.  Trop trend are flat.  No plans for further ischemic eval.  Cont asa,  blocker, acei, nitrate, statin, and plavix.  Will need case mgmt/SW assistance.  Must stress that usage of  blocker and cocaine could be lethal.  3. HFrEF: Euvolemic.  Noncompliant w/ meds.  Stressed importance of cocaine avoidance.  Cont  blocker and acei.    4.  LV thrombus:  Noncompliant w/ anticoagulation previously.  Was d/c'd on eliquis from [facility] in July but wasn't taking.  Not a coumadin candidate.  5.  Cocaine abuse:  SW consult.  Signed, Nicolasa Ducking, NP  06/23/2018, 9:07 AM    For questions or updates, please contact   Please consult [email or web address] for contact info under Cardiology/STEMI.
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
## 2018-06-25 ENCOUNTER — Other Ambulatory Visit: Payer: Self-pay

## 2018-06-25 ENCOUNTER — Inpatient Hospital Stay
Admission: EM | Admit: 2018-06-25 | Discharge: 2018-06-26 | DRG: 281 | Discharge status: Against Medical Advice | Payer: Medicaid - Out of State | Attending: Internal Medicine | Admitting: Internal Medicine

## 2018-06-25 ENCOUNTER — Emergency Department: Payer: Medicaid - Out of State

## 2018-06-25 DIAGNOSIS — I255 Ischemic cardiomyopathy: Secondary | ICD-10-CM | POA: Diagnosis present

## 2018-06-25 DIAGNOSIS — Z79899 Other long term (current) drug therapy: Secondary | ICD-10-CM | POA: Diagnosis not present

## 2018-06-25 DIAGNOSIS — Z9119 Patient's noncompliance with other medical treatment and regimen: Secondary | ICD-10-CM

## 2018-06-25 DIAGNOSIS — I5042 Chronic combined systolic (congestive) and diastolic (congestive) heart failure: Secondary | ICD-10-CM | POA: Diagnosis present

## 2018-06-25 DIAGNOSIS — I2510 Atherosclerotic heart disease of native coronary artery without angina pectoris: Secondary | ICD-10-CM

## 2018-06-25 DIAGNOSIS — Z9114 Patient's other noncompliance with medication regimen: Secondary | ICD-10-CM

## 2018-06-25 DIAGNOSIS — F17210 Nicotine dependence, cigarettes, uncomplicated: Secondary | ICD-10-CM | POA: Diagnosis present

## 2018-06-25 DIAGNOSIS — R748 Abnormal levels of other serum enzymes: Secondary | ICD-10-CM | POA: Diagnosis not present

## 2018-06-25 DIAGNOSIS — I110 Hypertensive heart disease with heart failure: Secondary | ICD-10-CM | POA: Diagnosis present

## 2018-06-25 DIAGNOSIS — I240 Acute coronary thrombosis not resulting in myocardial infarction: Secondary | ICD-10-CM

## 2018-06-25 DIAGNOSIS — F1920 Other psychoactive substance dependence, uncomplicated: Secondary | ICD-10-CM

## 2018-06-25 DIAGNOSIS — Z590 Homelessness: Secondary | ICD-10-CM | POA: Diagnosis not present

## 2018-06-25 DIAGNOSIS — G8929 Other chronic pain: Secondary | ICD-10-CM | POA: Diagnosis present

## 2018-06-25 DIAGNOSIS — E119 Type 2 diabetes mellitus without complications: Secondary | ICD-10-CM | POA: Diagnosis present

## 2018-06-25 DIAGNOSIS — Z8249 Family history of ischemic heart disease and other diseases of the circulatory system: Secondary | ICD-10-CM

## 2018-06-25 DIAGNOSIS — E785 Hyperlipidemia, unspecified: Secondary | ICD-10-CM | POA: Diagnosis present

## 2018-06-25 DIAGNOSIS — Z7901 Long term (current) use of anticoagulants: Secondary | ICD-10-CM | POA: Diagnosis not present

## 2018-06-25 DIAGNOSIS — I513 Intracardiac thrombosis, not elsewhere classified: Secondary | ICD-10-CM | POA: Diagnosis present

## 2018-06-25 DIAGNOSIS — I252 Old myocardial infarction: Secondary | ICD-10-CM | POA: Diagnosis not present

## 2018-06-25 DIAGNOSIS — R079 Chest pain, unspecified: Secondary | ICD-10-CM | POA: Diagnosis present

## 2018-06-25 DIAGNOSIS — F1410 Cocaine abuse, uncomplicated: Secondary | ICD-10-CM | POA: Diagnosis not present

## 2018-06-25 DIAGNOSIS — Z794 Long term (current) use of insulin: Secondary | ICD-10-CM

## 2018-06-25 DIAGNOSIS — Z95810 Presence of automatic (implantable) cardiac defibrillator: Secondary | ICD-10-CM | POA: Diagnosis not present

## 2018-06-25 DIAGNOSIS — Z7982 Long term (current) use of aspirin: Secondary | ICD-10-CM

## 2018-06-25 DIAGNOSIS — Z765 Malingerer [conscious simulation]: Secondary | ICD-10-CM

## 2018-06-25 DIAGNOSIS — I509 Heart failure, unspecified: Secondary | ICD-10-CM

## 2018-06-25 DIAGNOSIS — I214 Non-ST elevation (NSTEMI) myocardial infarction: Secondary | ICD-10-CM | POA: Diagnosis present

## 2018-06-25 DIAGNOSIS — I25110 Atherosclerotic heart disease of native coronary artery with unstable angina pectoris: Secondary | ICD-10-CM | POA: Diagnosis present

## 2018-06-25 DIAGNOSIS — Z91199 Patient's noncompliance with other medical treatment and regimen due to unspecified reason: Secondary | ICD-10-CM

## 2018-06-25 LAB — TROPONIN I
TROPONIN I: 5.48 ng/mL — AB (ref <0.03)
Troponin I: 5.48 ng/mL (ref <0.03)
Troponin I: 4.19 ng/mL (ref <0.03)
Troponin I: 5.37 ng/mL (ref <0.03)

## 2018-06-25 LAB — URINE DRUG SCREEN, QUALITATIVE (ARMC ONLY)
AMPHETAMINES, UR SCREEN: NOT DETECTED
Barbiturates, Ur Screen: NOT DETECTED
Benzodiazepine, Ur Scrn: NOT DETECTED
COCAINE METABOLITE, UR ~~LOC~~: POSITIVE — AB
Cannabinoid 50 Ng, Ur ~~LOC~~: NOT DETECTED
MDMA (ECSTASY) UR SCREEN: NOT DETECTED
METHADONE SCREEN, URINE: NOT DETECTED
OPIATE, UR SCREEN: POSITIVE — AB
Phencyclidine (PCP) Ur S: NOT DETECTED
Tricyclic, Ur Screen: NOT DETECTED

## 2018-06-25 LAB — APTT: aPTT: 28 seconds (ref 24–36)

## 2018-06-25 LAB — BASIC METABOLIC PANEL
Anion gap: 11 (ref 5–15)
BUN: 25 mg/dL — AB (ref 6–20)
CALCIUM: 8.9 mg/dL (ref 8.9–10.3)
CO2: 27 mmol/L (ref 22–32)
CREATININE: 1.11 mg/dL (ref 0.61–1.24)
Chloride: 90 mmol/L — ABNORMAL LOW (ref 98–111)
Glucose, Bld: 512 mg/dL (ref 70–99)
Potassium: 5.1 mmol/L (ref 3.5–5.1)
SODIUM: 128 mmol/L — AB (ref 135–145)

## 2018-06-25 LAB — GLUCOSE, CAPILLARY
Glucose-Capillary: 513 mg/dL (ref 70–99)
Glucose-Capillary: 335 mg/dL — ABNORMAL HIGH (ref 70–99)
Glucose-Capillary: 435 mg/dL — ABNORMAL HIGH (ref 70–99)
Glucose-Capillary: 280 mg/dL — ABNORMAL HIGH (ref 70–99)
Glucose-Capillary: 340 mg/dL — ABNORMAL HIGH (ref 70–99)

## 2018-06-25 LAB — PROTIME-INR
INR: 1.07
Prothrombin Time: 13.8 seconds (ref 11.4–15.2)

## 2018-06-25 LAB — HEPARIN LEVEL (UNFRACTIONATED)
Heparin Unfractionated: <0.1 IU/mL — ABNORMAL LOW (ref 0.30–0.70)
Heparin Unfractionated: <0.1 IU/mL — ABNORMAL LOW (ref 0.30–0.70)

## 2018-06-25 LAB — CBC
HCT: 39.2 % — ABNORMAL LOW (ref 40.0–52.0)
Hemoglobin: 13.6 g/dL (ref 13.0–18.0)
MCH: 34.9 pg — ABNORMAL HIGH (ref 26.0–34.0)
MCHC: 34.6 g/dL (ref 32.0–36.0)
MCV: 100.8 fL — AB (ref 80.0–100.0)
PLATELETS: 269 10*3/uL (ref 150–440)
RBC: 3.89 MIL/uL — ABNORMAL LOW (ref 4.40–5.90)
RDW: 14.2 % (ref 11.5–14.5)
WBC: 11.7 10*3/uL — AB (ref 3.8–10.6)

## 2018-06-25 LAB — TSH: TSH: 1.483 u[IU]/mL (ref 0.350–4.500)

## 2018-06-25 LAB — T4, FREE: Free T4: 1.31 ng/dL (ref 0.82–1.77)

## 2018-06-25 MED ORDER — CARVEDILOL 12.5 MG PO TABS
12.5000 mg | ORAL_TABLET | Freq: Two times a day (BID) | ORAL | Status: DC
  Administered 2018-06-25 – 2018-06-26 (×2): 12.5 mg via ORAL
  Filled 2018-06-25: qty 1
  Filled 2018-06-25: qty 2
  Filled 2018-06-25: qty 1

## 2018-06-25 MED ORDER — CLONAZEPAM 0.5 MG PO TABS
0.5000 mg | ORAL_TABLET | Freq: Two times a day (BID) | ORAL | Status: DC | PRN
  Administered 2018-06-25 – 2018-06-26 (×2): 0.5 mg via ORAL
  Filled 2018-06-25 (×2): qty 1

## 2018-06-25 MED ORDER — AMIODARONE HCL 200 MG PO TABS
200.0000 mg | ORAL_TABLET | Freq: Every day | ORAL | Status: DC
  Administered 2018-06-25 – 2018-06-26 (×2): 200 mg via ORAL
  Filled 2018-06-25 (×3): qty 1

## 2018-06-25 MED ORDER — ONDANSETRON HCL 4 MG PO TABS: 4.0000 mg | ORAL_TABLET | Freq: Four times a day (QID) | ORAL | Status: DC | PRN

## 2018-06-25 MED ORDER — HEPARIN (PORCINE) IN NACL 100-0.45 UNIT/ML-% IJ SOLN
1250.0000 [IU]/h | INTRAMUSCULAR | Status: DC
  Administered 2018-06-25: 850 [IU]/h via INTRAVENOUS
  Filled 2018-06-25 (×2): qty 250

## 2018-06-25 MED ORDER — INSULIN NPH (HUMAN) (ISOPHANE) 100 UNIT/ML ~~LOC~~ SUSP: 25.0000 [IU] | Freq: Two times a day (BID) | SUBCUTANEOUS | Status: DC

## 2018-06-25 MED ORDER — ISOSORBIDE MONONITRATE ER 30 MG PO TB24
15.0000 mg | ORAL_TABLET | Freq: Every day | ORAL | Status: DC
  Administered 2018-06-26: 15 mg via ORAL
  Filled 2018-06-25 (×3): qty 1

## 2018-06-25 MED ORDER — INSULIN ASPART 100 UNIT/ML ~~LOC~~ SOLN
0.0000 [IU] | Freq: Every day | SUBCUTANEOUS | Status: DC
  Administered 2018-06-25: 4 [IU] via SUBCUTANEOUS
  Filled 2018-06-25: qty 1

## 2018-06-25 MED ORDER — LEVOFLOXACIN 500 MG PO TABS
500.0000 mg | ORAL_TABLET | Freq: Every day | ORAL | Status: DC
  Administered 2018-06-25 – 2018-06-26 (×2): 500 mg via ORAL
  Filled 2018-06-25 (×2): qty 1

## 2018-06-25 MED ORDER — OXYCODONE HCL 5 MG PO TABS
5.0000 mg | ORAL_TABLET | Freq: Four times a day (QID) | ORAL | Status: DC | PRN
  Administered 2018-06-25 (×2): 5 mg via ORAL
  Filled 2018-06-25 (×3): qty 1

## 2018-06-25 MED ORDER — ASPIRIN 81 MG PO CHEW
81.0000 mg | CHEWABLE_TABLET | Freq: Every day | ORAL | Status: DC
  Administered 2018-06-25 – 2018-06-26 (×2): 81 mg via ORAL
  Filled 2018-06-25 (×2): qty 1

## 2018-06-25 MED ORDER — OXYCODONE-ACETAMINOPHEN 10-325 MG PO TABS: 1.0000 | ORAL_TABLET | Freq: Four times a day (QID) | ORAL | Status: DC | PRN

## 2018-06-25 MED ORDER — DIPHENHYDRAMINE HCL 50 MG/ML IJ SOLN
50.0000 mg | Freq: Once | INTRAMUSCULAR | Status: AC
  Administered 2018-06-25: 50 mg via INTRAVENOUS
  Filled 2018-06-25: qty 1

## 2018-06-25 MED ORDER — INSULIN ASPART 100 UNIT/ML ~~LOC~~ SOLN
0.0000 [IU] | Freq: Three times a day (TID) | SUBCUTANEOUS | Status: DC
  Administered 2018-06-25: 8 [IU] via SUBCUTANEOUS
  Administered 2018-06-26: 15 [IU] via SUBCUTANEOUS
  Filled 2018-06-25 (×2): qty 1

## 2018-06-25 MED ORDER — DOCUSATE SODIUM 100 MG PO CAPS
100.0000 mg | ORAL_CAPSULE | Freq: Two times a day (BID) | ORAL | Status: DC
  Administered 2018-06-25 – 2018-06-26 (×3): 100 mg via ORAL
  Filled 2018-06-25 (×3): qty 1

## 2018-06-25 MED ORDER — INSULIN ASPART PROT & ASPART (70-30 MIX) 100 UNIT/ML ~~LOC~~ SUSP
25.0000 [IU] | Freq: Two times a day (BID) | SUBCUTANEOUS | Status: DC
  Administered 2018-06-25 – 2018-06-26 (×2): 25 [IU] via SUBCUTANEOUS
  Filled 2018-06-25 (×2): qty 10

## 2018-06-25 MED ORDER — HEPARIN BOLUS VIA INFUSION
3500.0000 [IU] | Freq: Once | INTRAVENOUS | Status: AC
  Administered 2018-06-25: 3500 [IU] via INTRAVENOUS
  Filled 2018-06-25: qty 3500

## 2018-06-25 MED ORDER — LISINOPRIL 5 MG PO TABS
5.0000 mg | ORAL_TABLET | Freq: Every day | ORAL | Status: DC
  Administered 2018-06-25: 5 mg via ORAL
  Filled 2018-06-25 (×2): qty 1

## 2018-06-25 MED ORDER — ACETAMINOPHEN 325 MG PO TABS: 650.0000 mg | ORAL_TABLET | Freq: Four times a day (QID) | ORAL | Status: DC | PRN

## 2018-06-25 MED ORDER — ROSUVASTATIN CALCIUM 20 MG PO TABS
20.0000 mg | ORAL_TABLET | Freq: Every day | ORAL | Status: DC
  Administered 2018-06-25: 20 mg via ORAL
  Filled 2018-06-25: qty 1
  Filled 2018-06-25: qty 2
  Filled 2018-06-25: qty 1

## 2018-06-25 MED ORDER — GABAPENTIN 400 MG PO CAPS
800.0000 mg | ORAL_CAPSULE | Freq: Two times a day (BID) | ORAL | Status: DC
  Administered 2018-06-25 – 2018-06-26 (×2): 800 mg via ORAL
  Filled 2018-06-25 (×5): qty 2

## 2018-06-25 MED ORDER — FUROSEMIDE 20 MG PO TABS
20.0000 mg | ORAL_TABLET | Freq: Every day | ORAL | Status: DC
  Administered 2018-06-25 – 2018-06-26 (×2): 20 mg via ORAL
  Filled 2018-06-25 (×2): qty 1

## 2018-06-25 MED ORDER — INSULIN ASPART 100 UNIT/ML ~~LOC~~ SOLN
16.0000 [IU] | Freq: Once | SUBCUTANEOUS | Status: AC
  Administered 2018-06-25: 16 [IU] via SUBCUTANEOUS
  Filled 2018-06-25: qty 1

## 2018-06-25 MED ORDER — ACETAMINOPHEN 650 MG RE SUPP: 650.0000 mg | Freq: Four times a day (QID) | RECTAL | Status: DC | PRN

## 2018-06-25 MED ORDER — INSULIN ASPART 100 UNIT/ML ~~LOC~~ SOLN
SUBCUTANEOUS | Status: AC
  Filled 2018-06-25: qty 1

## 2018-06-25 MED ORDER — INSULIN ASPART 100 UNIT/ML ~~LOC~~ SOLN
10.0000 [IU] | Freq: Once | SUBCUTANEOUS | Status: AC
  Administered 2018-06-25: 10 [IU] via INTRAVENOUS

## 2018-06-25 MED ORDER — HEPARIN BOLUS VIA INFUSION
1800.0000 [IU] | Freq: Once | INTRAVENOUS | Status: AC
  Administered 2018-06-25: 1800 [IU] via INTRAVENOUS
  Filled 2018-06-25: qty 1800

## 2018-06-25 MED ORDER — SPIRONOLACTONE 25 MG PO TABS
12.5000 mg | ORAL_TABLET | Freq: Every day | ORAL | Status: DC
  Administered 2018-06-26: 12.5 mg via ORAL
  Filled 2018-06-25: qty 1
  Filled 2018-06-25: qty 0.5
  Filled 2018-06-25: qty 1
  Filled 2018-06-25: qty 0.5

## 2018-06-25 MED ORDER — SODIUM CHLORIDE 0.9 % IV SOLN
INTRAVENOUS | Status: DC
  Administered 2018-06-25: 09:00:00 via INTRAVENOUS

## 2018-06-25 MED ORDER — ALBUTEROL SULFATE (2.5 MG/3ML) 0.083% IN NEBU
2.5000 mg | INHALATION_SOLUTION | RESPIRATORY_TRACT | Status: DC | PRN
  Administered 2018-06-25 (×2): 2.5 mg via RESPIRATORY_TRACT
  Filled 2018-06-25 (×2): qty 3

## 2018-06-25 MED ORDER — OXYCODONE-ACETAMINOPHEN 5-325 MG PO TABS
1.0000 | ORAL_TABLET | Freq: Four times a day (QID) | ORAL | Status: DC | PRN
  Administered 2018-06-25: 1 via ORAL
  Filled 2018-06-25: qty 1

## 2018-06-25 MED ORDER — ONDANSETRON HCL 4 MG/2ML IJ SOLN: 4.0000 mg | Freq: Four times a day (QID) | INTRAMUSCULAR | Status: DC | PRN

## 2018-06-25 MED ORDER — INSULIN ASPART PROT & ASPART (70-30 MIX) 100 UNIT/ML ~~LOC~~ SUSP: 25.0000 [IU] | Freq: Two times a day (BID) | SUBCUTANEOUS | Status: DC

## 2018-06-25 NOTE — ED Notes (Signed)
Eula Listen, PA-C with cardiology in room to see patient.

## 2018-06-25 NOTE — Progress Notes (Signed)
ANTICOAGULATION CONSULT NOTE - Initial Consult  Pharmacy Consult for heparin Indication: chest pain/ACS  Allergies  Allergen Reactions  . Codeine Swelling  . Penicillins Swelling    Has patient had a PCN reaction causing immediate rash, facial/tongue/throat swelling, SOB or lightheadedness with hypotension: Yes Has patient had a PCN reaction causing severe rash involving mucus membranes or skin necrosis: No Has patient had a PCN reaction that required hospitalization: No Has patient had a PCN reaction occurring within the last 10 years: No If all of the above answers are "NO", then may proceed with Cephalosporin use.  . Tramadol Swelling    Patient Measurements: Height: 5\' 8"  (172.7 cm) Weight: 131 lb 9.6 oz (59.7 kg) IBW/kg (Calculated) : 68.4 Heparin Dosing Weight: 58.5 kg   Vital Signs: Temp: 97.7 F (36.5 C) (09/25 1220) Temp Source: Oral (09/25 1220) BP: 83/59 (09/25 1313) Pulse Rate: 73 (09/25 1313)  Labs: Recent Labs    06/22/18 2304 06/23/18 0716 06/25/18 0456 06/25/18 0725 06/25/18 0857 06/25/18 1456  HGB 13.2 13.2 13.6  --   --   --   HCT 37.8* 38.0* 39.2*  --   --   --   PLT 223 207 269  --   --   --   APTT  --   --   --  28  --   --   LABPROT  --   --   --  13.8  --   --   INR  --   --   --  1.07  --   --   HEPARINUNFRC <0.10* <0.10*  --  <0.10*  --  <0.10*  CREATININE 0.97  --  1.11  --   --   --   TROPONINI 8.45*  --  5.48*  --  5.48* 4.19*    Estimated Creatinine Clearance: 61.3 mL/min (by C-G formula based on SCr of 1.11 mg/dL).   Medical History: Past Medical History:  Diagnosis Date  . Acute MI (HCC)   . CHF (congestive heart failure) (HCC)   . Diabetes mellitus without complication (HCC)   . H/O blood clots   . Hyperlipemia   . Hypertension   . Left kidney mass     Medications:  Scheduled:  . amiodarone  200 mg Oral Daily  . aspirin  81 mg Oral Daily  . carvedilol  12.5 mg Oral BID WC  . docusate sodium  100 mg Oral BID  .  furosemide  20 mg Oral Daily  . gabapentin  800 mg Oral BID  . insulin aspart  0-15 Units Subcutaneous TID WC  . insulin aspart  0-5 Units Subcutaneous QHS  . insulin aspart protamine- aspart  25 Units Subcutaneous BID WC  . isosorbide mononitrate  15 mg Oral Daily  . levofloxacin  500 mg Oral Daily  . lisinopril  5 mg Oral Daily  . rosuvastatin  20 mg Oral QHS  . spironolactone  12.5 mg Oral Daily    Assessment: Patient was here few days ago w/ CP and elevated troponins and acute respiratory failure. Patient here again for CP and SOB w/ initial trops of 5.48 EKG showing atrial paced rhythm w/ wide QRS complex and inverted T-waves in V2 leads Patient is being started on heparin drip for NSTEMI Was suppose to be started on eliquis but was d/c'd before receiving a dose; patient is also homeless so was unable to get his medication.  9/25 heparin drip started @ 850units/hr  Goal of Therapy:  Heparin level 0.3-0.7 units/ml Monitor platelets by anticoagulation protocol: Yes   Plan:  9/25 1456 HL <0.10. Heparin level is subtherapeutic. Will order heparin 1800 unit bolus and increase heparin drip to 1100 units.hr.  Will monitor daily CBC's and adjust per anti-Xa levels. Will check anti-Xa level at 2200.  Gardner Candle, PharmD, BCPS Clinical Pharmacist 06/25/2018 4:18 PM

## 2018-06-25 NOTE — Progress Notes (Addendum)
Patient's CBG 435, per Dr. Nemiah Commander give 16 units of insulin, MD also notified of low BP 83/61, HR 70. Pt has scheduled Imdur 30mg , Spironolactone 25mg , Per Dr. Nemiah Commander okay to hold. Will continue to monitor.

## 2018-06-25 NOTE — Progress Notes (Addendum)
Pt complaining of sob, O2 sats 97% on 2L n/c. Crackles on auscultation. Dr. Nemiah Commander notified, received verbal orders to d/c 0.9% N/S continuous fluids. Will administer PRN breathing tx and continue to monitor.

## 2018-06-25 NOTE — Progress Notes (Signed)
ANTICOAGULATION CONSULT NOTE - Initial Consult  Pharmacy Consult for heparin Indication: chest pain/ACS  Allergies  Allergen Reactions  . Codeine Swelling  . Penicillins Swelling    Has patient had a PCN reaction causing immediate rash, facial/tongue/throat swelling, SOB or lightheadedness with hypotension: Yes Has patient had a PCN reaction causing severe rash involving mucus membranes or skin necrosis: No Has patient had a PCN reaction that required hospitalization: No Has patient had a PCN reaction occurring within the last 10 years: No If all of the above answers are "NO", then may proceed with Cephalosporin use.  . Tramadol Swelling    Patient Measurements: Height: 5\' 8"  (172.7 cm) Weight: 129 lb (58.5 kg) IBW/kg (Calculated) : 68.4 Heparin Dosing Weight: 58.5 kg   Vital Signs: Temp: 97.6 F (36.4 C) (09/25 0456) Temp Source: Oral (09/25 0456) BP: 97/68 (09/25 0600) Pulse Rate: 80 (09/25 0600)  Labs: Recent Labs    06/22/18 0721  06/22/18 0722  06/22/18 1501 06/22/18 2304 06/23/18 0716 06/25/18 0456  HGB 14.1  --   --   --   --  13.2 13.2 13.6  HCT 40.7  --   --   --   --  37.8* 38.0* 39.2*  PLT 211  --   --   --   --  223 207 269  APTT  --   --  31  --   --   --   --   --   LABPROT  --   --  13.3  --   --   --   --   --   INR  --   --  1.02  --   --   --   --   --   HEPARINUNFRC  --    < > <0.10*  --  <0.10* <0.10* <0.10*  --   CREATININE 1.12  --   --   --   --  0.97  --  1.11  TROPONINI 8.22*  --   --    < > 8.56* 8.45*  --  5.48*   < > = values in this interval not displayed.    Estimated Creatinine Clearance: 60 mL/min (by C-G formula based on SCr of 1.11 mg/dL).   Medical History: Past Medical History:  Diagnosis Date  . Acute MI (HCC)   . CHF (congestive heart failure) (HCC)   . Diabetes mellitus without complication (HCC)   . H/O blood clots   . Hyperlipemia   . Hypertension   . Left kidney mass     Medications:  Scheduled:  . heparin   3,500 Units Intravenous Once    Assessment: Patient was here few days ago w/ CP and elevated troponins and acute respiratory failure. Patient here again for CP and SOB w/ initial trops of 5.48 EKG showing atrial paced rhythm w/ wide QRS complex and inverted T-waves in V2 leads Patient is being started on heparin drip for NSTEMI Was suppose to be started on eliquis but was d/c'd before receiving a dose; patient is also homeless so was unable to get his medication.  Goal of Therapy:  Heparin level 0.3-0.7 units/ml Monitor platelets by anticoagulation protocol: Yes   Plan:  Will bolus w/ heparin 3500 units IV x 1 Will start heparin drip at 850 units/hr  Baseline labs ordered Will monitor daily CBC's and adjust per anti-Xa levels. Will check anti-Xa level at 1300.  [name], PharmD, BCPS Clinical Pharmacist 06/25/2018

## 2018-06-25 NOTE — ED Notes (Signed)
Pt given small amount of crackers and coffee.

## 2018-06-25 NOTE — ED Notes (Addendum)
Lab called was a critical trop of 5.48 and glucose of 512. Dr. Manson Passey made aware

## 2018-06-25 NOTE — ED Notes (Signed)
CBG 335  

## 2018-06-25 NOTE — Progress Notes (Signed)
Patient with ischemic cardiomyopathy EF 10%, s/p AICD, recent discharge after NSTEMI- comes with chest pain- elevated troponin- but trending down from prior - on heparin drip- eliquis on hold - cards consulted - homeless, non compliant with meds - urine tox positive for cocaine - patient briefly seen- plan discussed with him

## 2018-06-25 NOTE — Plan of Care (Signed)

## 2018-06-25 NOTE — H&P (Signed)
Mario Gallagher is an 57 y.o. male.   Chief Complaint: Chest pain HPI: The patient with past medical history of coronary artery disease, hypertension and congestive heart failure sent to the emergency department with chest pain.  The patient states his pain began while he was walking.  The pain is centralized and radiates into his left chest.  He admits some shortness of breath associated with the pain.  Patient denies nausea, vomiting or diaphoresis.  Laboratory evaluation revealed very elevated troponin which prompted the emergency department staff to call hospitalist service for admission.  Past Medical History:  Diagnosis Date  . Acute MI ([place])   . CHF (congestive heart failure) ([place])   . Diabetes mellitus without complication ([place])   . H/O blood clots   . Hyperlipemia   . Hypertension   . Left kidney mass     Past Surgical History:  Procedure Laterality Date  . CARDIAC DEFIBRILLATOR PLACEMENT    . HERNIA REPAIR      Family History  Problem Relation Age of Onset  . Heart disease Father   . Hypertension Brother   . Hyperlipidemia Brother   . Rheumatic fever Mother   . Mitral valve prolapse Mother    Social History:  reports that he has been smoking cigarettes. He has been smoking about 0.50 packs per day. He has never used smokeless tobacco. He reports that he does not drink alcohol or use drugs.  Allergies:  Allergies  Allergen Reactions  . Codeine Swelling  . Penicillins Swelling    Has patient had a PCN reaction causing immediate rash, facial/tongue/throat swelling, SOB or lightheadedness with hypotension: Yes Has patient had a PCN reaction causing severe rash involving mucus membranes or skin necrosis: No Has patient had a PCN reaction that required hospitalization: No Has patient had a PCN reaction occurring within the last 10 years: No If all of the above answers are "NO", then may proceed with Cephalosporin use.  . Tramadol Swelling     (Not in a hospital  admission)  Results for orders placed or performed during the hospital encounter of 06/25/18 (from the past 48 hour(s))  Glucose, capillary     Status: Abnormal   Collection Time: 06/25/18  4:56 AM  Result Value Ref Range   Glucose-Capillary 513 (HH) 70 - 99 mg/dL   Comment 1 Notify RN   Basic metabolic panel     Status: Abnormal   Collection Time: 06/25/18  4:56 AM  Result Value Ref Range   Sodium 128 (L) 135 - 145 mmol/L   Potassium 5.1 3.5 - 5.1 mmol/L   Chloride 90 (L) 98 - 111 mmol/L   CO2 27 22 - 32 mmol/L   Glucose, Bld 512 (HH) 70 - 99 mg/dL    Comment: CRITICAL RESULT CALLED TO, READ BACK BY AND VERIFIED WITH BRITTANY SAMPSON @0535  06/25/18 [facility]    BUN 25 (H) 6 - 20 mg/dL   Creatinine, Ser 1.11 0.61 - 1.24 mg/dL   Calcium 8.9 8.9 - 10.3 mg/dL   GFR calc non Af Amer >60 >60 mL/min   GFR calc Af Amer >60 >60 mL/min    Comment: (NOTE) The eGFR has been calculated using the CKD EPI equation. This calculation has not been validated in all clinical situations. eGFR's persistently <60 mL/min signify possible Chronic Kidney Disease.    Anion gap 11 5 - 15    Comment: Performed at [facility], [address] [place], [name] [address]  CBC  Status: Abnormal   Collection Time: 06/25/18  4:56 AM  Result Value Ref Range   WBC 11.7 (H) 3.8 - 10.6 K/uL   RBC 3.89 (L) 4.40 - 5.90 MIL/uL   Hemoglobin 13.6 13.0 - 18.0 g/dL   HCT 39.2 (L) 40.0 - 52.0 %   MCV 100.8 (H) 80.0 - 100.0 fL   MCH 34.9 (H) 26.0 - 34.0 pg   MCHC 34.6 32.0 - 36.0 g/dL   RDW 14.2 11.5 - 14.5 %   Platelets 269 150 - 440 K/uL    Comment: Performed at [facility], [address] [place], [place] [address]  Troponin I     Status: Abnormal   Collection Time: 06/25/18  4:56 AM  Result Value Ref Range   Troponin I 5.48 (HH) <0.03 ng/mL    Comment: CRITICAL RESULT CALLED TO, READ BACK BY AND VERIFIED WITH BRITTANY SAMPSON @0535  06/25/18 [facility] Performed at [facility], [address] [place], [place] [address]    Dg Chest Port 1 View  Result Date: 06/25/2018 CLINICAL DATA:  57 year old male with chest pain. EXAM: PORTABLE CHEST 1 VIEW COMPARISON:  Chest radiograph dated 06/22/2018 FINDINGS: There is cardiomegaly with vascular congestion. Shallow inspiration with bibasilar atelectasis. Probable trace bilateral pleural effusions. No focal consolidation or pneumothorax. Left pectoral AICD device. No acute osseous pathology. IMPRESSION: Cardiomegaly with findings of CHF grossly unchanged since the most recent prior radiograph. Electronically Signed   By: Anner Crete M.D.   On: 06/25/2018 06:01    Review of Systems  Constitutional: Negative for chills and fever.  HENT: Negative for sore throat and tinnitus.   Eyes: Negative for blurred vision and redness.  Respiratory: Negative for cough and shortness of breath.   Cardiovascular: Positive for chest pain. Negative for palpitations, orthopnea and PND.  Gastrointestinal: Negative for abdominal pain, diarrhea, nausea and vomiting.  Genitourinary: Negative for dysuria, frequency and urgency.  Musculoskeletal: Negative for joint pain and myalgias.  Skin: Negative for rash.       No lesions  Neurological: Negative for speech change, focal weakness and weakness.  Endo/Heme/Allergies: Does not bruise/bleed easily.       No temperature intolerance  Psychiatric/Behavioral: Negative for depression and suicidal ideas.    Blood pressure 97/68, pulse 80, temperature 97.6 F (36.4 C), temperature source Oral, resp. rate 18, height 5' 8"  (1.727 m), weight 58.5 kg, SpO2 100 %. Physical Exam  Vitals reviewed. Constitutional: He is oriented to person, place, and time. He appears well-developed and well-nourished. No distress.  HENT:  Head: Normocephalic and atraumatic.  Mouth/Throat: Oropharynx is clear and moist.  Eyes: Pupils are equal, round, and reactive to light. Conjunctivae and EOM are normal. No scleral icterus.   Neck: Normal range of motion. Neck supple. No JVD present. No tracheal deviation present. No thyromegaly present.  Cardiovascular: Normal rate, regular rhythm and normal heart sounds. Exam reveals no gallop and no friction rub.  No murmur heard. Respiratory: Effort normal and breath sounds normal. No respiratory distress.  GI: Soft. Bowel sounds are normal. He exhibits no distension. There is no tenderness.  Genitourinary:  Genitourinary Comments: Deferred  Musculoskeletal: Normal range of motion. He exhibits no edema.  Lymphadenopathy:    He has no cervical adenopathy.  Neurological: He is alert and oriented to person, place, and time. No cranial nerve deficit.  Skin: Skin is warm and dry. No rash noted. No erythema.  Psychiatric: He has a normal mood and affect. His behavior is normal. Judgment  and thought content normal.     Assessment/Plan This is a 57 year old male admitted for NSTEMI. 1.  NSTEMI: Start heparin drip.  Consult cardiology.  Manage heart rate.  Supplemental oxygen as needed. 2.  CAD: Unstable; continue aspirin and Imdur 3.  Hypertension: Controlled; continue lisinopril as long as pressure is not too low. 4.  CHF: Systolic; pacemaker in place.  Continue amiodarone and carvedilol.  Lasix and spironolactone for maintenance diuresis 5.  Diabetes mellitus type 2: Uncontrolled; hold metformin.  Sliding scale insulin while hospitalized. 6.  Hyperlipidemia: Continue statin therapy 7.  DVT prophylaxis: Therapeutic anticoagulation 8.  GI prophylaxis: None The patient is a full code.  Time spent on admission orders and patient care approximately 45 minutes  Harrie Foreman, MD 06/25/2018, 7:18 AM

## 2018-06-25 NOTE — Progress Notes (Signed)
Pt complaining of SOB. Upon arrival to room, pt had taken oxygen off and requesting breathing treatment. O2 sats 97% on RA. Prn breathing treatment given. Will continue to monitor.

## 2018-06-25 NOTE — ED Triage Notes (Signed)
Pt c/o chest pain and SOB that started 50 mins ago. Pt has been seen 2 other times for the same and was released from the hospital on Monday. Pt stated that he homeless and needs help getting his medication.
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
## 2018-06-25 NOTE — ED Notes (Signed)
Meal tray ordered for patient.

## 2018-06-26 ENCOUNTER — Encounter: Payer: Self-pay | Admitting: *Deleted

## 2018-06-26 ENCOUNTER — Inpatient Hospital Stay: Payer: Medicaid - Out of State

## 2018-06-26 LAB — HEPARIN LEVEL (UNFRACTIONATED)

## 2018-06-26 LAB — GLUCOSE, CAPILLARY: Glucose-Capillary: 385 mg/dL — ABNORMAL HIGH (ref 70–99)

## 2018-06-26 MED ORDER — PHENOL 1.4 % MT LIQD
2.0000 | OROMUCOSAL | Status: DC | PRN
  Administered 2018-06-26: 2 via OROMUCOSAL
  Filled 2018-06-26: qty 177

## 2018-06-26 MED ORDER — KETOROLAC TROMETHAMINE 15 MG/ML IJ SOLN
15.0000 mg | Freq: Four times a day (QID) | INTRAMUSCULAR | Status: DC | PRN
  Filled 2018-06-26: qty 1

## 2018-06-26 MED ORDER — INSULIN ASPART PROT & ASPART (70-30 MIX) 100 UNIT/ML ~~LOC~~ SUSP: 30.0000 [IU] | Freq: Two times a day (BID) | SUBCUTANEOUS | Status: DC

## 2018-06-26 MED ORDER — APIXABAN 5 MG PO TABS
5.0000 mg | ORAL_TABLET | Freq: Two times a day (BID) | ORAL | Status: DC
  Administered 2018-06-26: 5 mg via ORAL
  Filled 2018-06-26: qty 1

## 2018-06-26 MED ORDER — HEPARIN BOLUS VIA INFUSION
2000.0000 [IU] | Freq: Once | INTRAVENOUS | Status: AC
  Administered 2018-06-26: 2000 [IU] via INTRAVENOUS
  Filled 2018-06-26: qty 2000

## 2018-06-26 NOTE — Progress Notes (Signed)
Pt very upset because he cannot have his Oxycodone. Demanding to speak to MD now and threatening to leave AMA. Pt took himself off the telemetry monitor. Dr. Nemiah Commander notified. Meds picked up  from pharmacy and returned to patient. Pt requesting a bus pass. Patient made aware that since he is leaving AMA he can not have a bus pass. Pt walked out and refused to sign AMA form. IVs taken out. Clean,dry, and intact. Form placed on chart.

## 2018-06-26 NOTE — Progress Notes (Signed)
ANTICOAGULATION CONSULT NOTE - Initial Consult  Pharmacy Consult for heparin Indication: chest pain/ACS  Allergies  Allergen Reactions  . Codeine Swelling  . Penicillins Swelling    Has patient had a PCN reaction causing immediate rash, facial/tongue/throat swelling, SOB or lightheadedness with hypotension: Yes Has patient had a PCN reaction causing severe rash involving mucus membranes or skin necrosis: No Has patient had a PCN reaction that required hospitalization: No Has patient had a PCN reaction occurring within the last 10 years: No If all of the above answers are "NO", then may proceed with Cephalosporin use.  . Tramadol Swelling    Patient Measurements: Height: 5\' 8"  (172.7 cm) Weight: 134 lb 12.8 oz (61.1 kg) IBW/kg (Calculated) : 68.4 Heparin Dosing Weight: 58.5 kg   Vital Signs: Temp: 98.2 F (36.8 C) (09/26 0351) Temp Source: Oral (09/26 0351) BP: 114/72 (09/26 0351) Pulse Rate: 84 (09/26 0351)  Labs: Recent Labs    06/23/18 0716  06/25/18 0456 06/25/18 0725 06/25/18 0857 06/25/18 1456 06/25/18 2221  HGB 13.2  --  13.6  --   --   --   --   HCT 38.0*  --  39.2*  --   --   --   --   PLT 207  --  269  --   --   --   --   APTT  --   --   --  28  --   --   --   LABPROT  --   --   --  13.8  --   --   --   INR  --   --   --  1.07  --   --   --   HEPARINUNFRC <0.10*  --   --  <0.10*  --  <0.10* <0.10*  CREATININE  --   --  1.11  --   --   --   --   TROPONINI  --    < > 5.48*  --  5.48* 4.19* 5.37*   < > = values in this interval not displayed.    Estimated Creatinine Clearance: 62.7 mL/min (by C-G formula based on SCr of 1.11 mg/dL).   Medical History: Past Medical History:  Diagnosis Date  . Acute MI (HCC)   . CHF (congestive heart failure) (HCC)   . Diabetes mellitus without complication (HCC)   . H/O blood clots   . Hyperlipemia   . Hypertension   . Left kidney mass     Medications:  Scheduled:  . amiodarone  200 mg Oral Daily  . aspirin   81 mg Oral Daily  . carvedilol  12.5 mg Oral BID WC  . docusate sodium  100 mg Oral BID  . furosemide  20 mg Oral Daily  . gabapentin  800 mg Oral BID  . heparin  2,000 Units Intravenous Once  . insulin aspart  0-15 Units Subcutaneous TID WC  . insulin aspart  0-5 Units Subcutaneous QHS  . insulin aspart protamine- aspart  25 Units Subcutaneous BID WC  . isosorbide mononitrate  15 mg Oral Daily  . levofloxacin  500 mg Oral Daily  . lisinopril  5 mg Oral Daily  . rosuvastatin  20 mg Oral QHS  . spironolactone  12.5 mg Oral Daily    Assessment: Patient was here few days ago w/ CP and elevated troponins and acute respiratory failure. Patient here again for CP and SOB w/ initial trops of 5.48 EKG showing atrial paced rhythm  w/ wide QRS complex and inverted T-waves in V2 leads Patient is being started on heparin drip for NSTEMI Was suppose to be started on eliquis but was d/c'd before receiving a dose; patient is also homeless so was unable to get his medication.  9/25 heparin drip started @ 850units/hr  Goal of Therapy:  Heparin level 0.3-0.7 units/ml Monitor platelets by anticoagulation protocol: Yes   Plan:  09/26 @ 2200 HL < 0.10 subtherapeutic. Will rebolus w/ heparin 2000 units IV x 1 and increase rate to 1250 units/hr and will recheck HL @ 1000.  [name], PharmD, BCPS Clinical Pharmacist 06/26/2018 4:12 AM

## 2018-06-27 NOTE — Discharge Summary (Signed)
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 Acute MI (HCC)   . CHF (congestive heart failure) (HCC)   . Diabetes mellitus without complication (HCC)   . H/O blood clots   . Hyperlipemia   . Hypertension   . Left kidney mass     HOSPITAL COURSE:   57 year old male with past medical history significant for CAD status post PCI, ischemic cardiomyopathy with known EF of 15 to 20%, status post ICD with history of V. tach and V. fib, mural thrombus on Eliquis, ongoing smoking alcohol and drug seeking behavior, hypertension presents to hospital secondary to chest pain Patient has had multiple admissions for the same.  He was started on heparin drip due to elevated troponin.  Seen by cardiology, however patient only requesting for Percocet in the hospital. -Urine tox was positive for cocaine.  Cardiology refused to do any procedure and recommended outpatient follow-up.  They have signed off.  Patient mentioned that he will not stop using cocaine.  All he wanted was oxycodone.  When oxycodone was discontinued, he left AMA. Would not advise admitting the patient again if he is coming for oxycodone.  Please do not prescribe opioids on admission.  DISCHARGE CONDITIONS:   Guarded  CONSULTS OBTAINED:   Cardiology  consultation by [facility]  DRUG ALLERGIES:   Allergies  Allergen Reactions  . Codeine Swelling  . Penicillins Swelling    Has patient had a PCN reaction causing immediate rash, facial/tongue/throat swelling, SOB or lightheadedness with hypotension: Yes Has patient had a PCN reaction causing severe rash involving mucus membranes or skin necrosis: No Has patient had a PCN reaction that required hospitalization: No Has patient had a PCN reaction occurring within the last 10 years: No If all of the above answers are "NO", then may proceed with Cephalosporin use.  . Tramadol Swelling   DISCHARGE MEDICATIONS:   Allergies as of 06/26/2018      Reactions   Codeine Swelling   Penicillins Swelling   Has patient had a PCN reaction causing immediate rash, facial/tongue/throat swelling, SOB or lightheadedness with hypotension: Yes Has patient had a PCN reaction causing severe rash involving mucus membranes or skin necrosis: No Has patient had a PCN reaction that required hospitalization: No Has patient had a PCN reaction occurring within the last 10 years: No If all of the above answers are "NO", then may proceed with Cephalosporin use.   Tramadol Swelling      Medication List    ASK your doctor about these medications   albuterol 108 (90 Base) MCG/ACT inhaler Commonly known as:  PROVENTIL HFA;VENTOLIN HFA Inhale 2 puffs into the lungs every 6 (six) hours as needed for wheezing or shortness of breath.   amiodarone 400 MG tablet Commonly known as:  PACERONE  Take 1 tablet (400 mg total) by mouth daily.   apixaban 5 MG Tabs tablet Commonly known as:  ELIQUIS Take 5 mg by mouth 2 (two) times daily.   aspirin 81 MG chewable tablet Chew 1 tablet (81 mg total) by mouth daily.   carvedilol 3.125 MG tablet Commonly known as:  COREG Take 4 tablets (12.5 mg total) by mouth 2 (two) times daily with a meal.   clonazePAM 0.5 MG tablet Commonly known as:  KLONOPIN Take 0.5 mg by mouth 2 (two)  times daily as needed for anxiety.   furosemide 20 MG tablet Commonly known as:  LASIX Take 1 tablet (20 mg total) by mouth daily.   gabapentin 800 MG tablet Commonly known as:  NEURONTIN Take 800 mg by mouth 2 (two) times daily.   insulin NPH Human 100 UNIT/ML injection Commonly known as:  HUMULIN N,NOVOLIN N Inject 25 Units into the skin 2 (two) times daily.   insulin regular 100 units/mL injection Commonly known as:  NOVOLIN R,HUMULIN R Inject 2-8 Units into the skin 3 (three) times daily. Per sliding scale   isosorbide mononitrate 30 MG 24 hr tablet Commonly known as:  IMDUR Take 0.5 tablets (15 mg total) by mouth daily.   levofloxacin 500 MG tablet Commonly known as:  LEVAQUIN Take 1 tablet (500 mg total) by mouth daily for 5 days.   lisinopril 5 MG tablet Commonly known as:  PRINIVIL,ZESTRIL Take 1 tablet (5 mg total) by mouth daily.   metFORMIN 500 MG tablet Commonly known as:  GLUCOPHAGE Take 500 mg by mouth 2 (two) times daily with a meal.   metFORMIN 500 MG tablet Commonly known as:  GLUCOPHAGE Take 1 tablet (500 mg total) by mouth 2 (two) times daily with a meal.   oxyCODONE-acetaminophen 10-325 MG tablet Commonly known as:  PERCOCET Take 1 tablet by mouth every 6 (six) hours as needed for pain.   rosuvastatin 20 MG tablet Commonly known as:  CRESTOR Take 1 tablet (20 mg total) by mouth daily at 6 PM.   spironolactone 25 MG tablet Commonly known as:  ALDACTONE Take 0.5 tablets (12.5 mg total) by mouth daily.        DISCHARGE INSTRUCTIONS:   1. PCP f/u in 1 week 2. Cardiology f/u in 1 week  DIET:   Cardiac diet  ACTIVITY:   Activity as tolerated  OXYGEN:   Home Oxygen: No.  Oxygen Delivery: room air  DISCHARGE LOCATION:   Patient left AMA  If you experience worsening of your admission symptoms, develop shortness of breath, life threatening emergency, suicidal or homicidal thoughts you must seek medical attention immediately by  calling 911 or calling your MD immediately  if symptoms less severe.  You Must read complete instructions/literature along with all the possible adverse reactions/side effects for all the Medicines you take and that have been prescribed to you. Take any new Medicines after you have completely understood and accpet all the possible adverse reactions/side effects.   Please note  You were cared for by a hospitalist during your hospital stay. If you have any questions about your discharge medications or the care you received while you were in the hospital after you are discharged, you can call the unit and asked to speak with the hospitalist on call if the hospitalist that took care of you is not available. Once you are discharged, your primary care physician will handle any further medical issues. Please note that NO REFILLS for any discharge medications will  be authorized once you are discharged, as it is imperative that you return to your primary care physician (or establish a relationship with a primary care physician if you do not have one) for your aftercare needs so that they can reassess your need for medications and monitor your lab values.    On the day of Discharge:  VITAL SIGNS:   Blood pressure 113/69, pulse 87, temperature 98.7 F (37.1 C), temperature source Oral, resp. rate 18, height 5\' 8"  (1.727 m), weight 61.1 kg, SpO2 95 %.  PHYSICAL EXAMINATION:    GENERAL:  57 y.o.-year-old disheveled appearing patient lying in the bed with no acute distress.  EYES: Pupils equal, round, reactive to light and accommodation. No scleral icterus. Extraocular muscles intact.  HEENT: Head atraumatic, normocephalic. Oropharynx and nasopharynx clear.  NECK:  Supple, no jugular venous distention. No thyroid enlargement, no tenderness.  LUNGS: Normal breath sounds bilaterally, no wheezing, rales,rhonchi or crepitation. No use of accessory muscles of respiration.  CARDIOVASCULAR: S1, S2 normal. No  murmurs, rubs, or gallops.  Tender to touch left chest ABDOMEN: Soft, non-tender, non-distended. Bowel sounds present. No organomegaly or mass.  EXTREMITIES: No pedal edema, cyanosis, or clubbing.  NEUROLOGIC: Cranial nerves II through XII are intact. Muscle strength 5/5 in all extremities. Sensation intact. Gait not checked.  PSYCHIATRIC: The patient is alert and oriented x 3.  SKIN: No obvious rash, lesion, or ulcer.   DATA REVIEW:   CBC Recent Labs  Lab 06/25/18 0456  WBC 11.7*  HGB 13.6  HCT 39.2*  PLT 269    Chemistries  Recent Labs  Lab 06/25/18 0456  NA 128*  K 5.1  CL 90*  CO2 27  GLUCOSE 512*  BUN 25*  CREATININE 1.11  CALCIUM 8.9     Microbiology Results  No results found for this or any previous visit.  RADIOLOGY:  No results found.   Management plans discussed with the patient, family and they are in agreement.  CODE STATUS:  Code Status History    Date Active Date Inactive Code Status Order ID Comments User Context   06/25/2018 0844 06/26/2018 1335 Full Code [id number]  Arnaldo Natal, MD ED   06/22/2018 1021 06/23/2018 1844 Full Code [id number]  Ihor Austin, MD Inpatient   06/19/2018 1450 06/20/2018 1740 Full Code [id number]  Campbell Stall, MD ED   05/19/2018 2120 05/20/2018 1519 Full Code [id number]  Elder Negus, RN Inpatient      TOTAL TIME TAKING CARE OF THIS PATIENT: 38  minutes.    Enid Baas M.D on 06/27/2018 at 5:38 PM  Between 7am to 6pm - Pager - [phone]  After 6pm go to [email or web address] - [employer] research officer, government  [facility] [place] [facility]  Office  [phone]  CC: Primary care physician; System, Pcp Not In   Note: This dictation was prepared with Dragon dictation along with smaller phrase technology. Any transcriptional errors that result from this process are unintentional.

## 2018-06-28 ENCOUNTER — Inpatient Hospital Stay
Admission: EM | Admit: 2018-06-28 | Discharge: 2018-07-01 | DRG: 308 | Disposition: E | Payer: Medicaid - Out of State | Attending: Internal Medicine | Admitting: Internal Medicine

## 2018-06-28 ENCOUNTER — Emergency Department: Payer: Medicaid - Out of State

## 2018-06-28 ENCOUNTER — Other Ambulatory Visit: Payer: Self-pay

## 2018-06-28 ENCOUNTER — Encounter: Payer: Self-pay | Admitting: Emergency Medicine

## 2018-06-28 DIAGNOSIS — E1165 Type 2 diabetes mellitus with hyperglycemia: Secondary | ICD-10-CM

## 2018-06-28 DIAGNOSIS — F1410 Cocaine abuse, uncomplicated: Secondary | ICD-10-CM | POA: Diagnosis present

## 2018-06-28 DIAGNOSIS — I472 Ventricular tachycardia, unspecified: Secondary | ICD-10-CM

## 2018-06-28 DIAGNOSIS — Z8249 Family history of ischemic heart disease and other diseases of the circulatory system: Secondary | ICD-10-CM

## 2018-06-28 DIAGNOSIS — E1310 Other specified diabetes mellitus with ketoacidosis without coma: Secondary | ICD-10-CM

## 2018-06-28 DIAGNOSIS — I5084 End stage heart failure: Secondary | ICD-10-CM | POA: Diagnosis present

## 2018-06-28 DIAGNOSIS — G9340 Encephalopathy, unspecified: Secondary | ICD-10-CM | POA: Diagnosis present

## 2018-06-28 DIAGNOSIS — Z590 Homelessness: Secondary | ICD-10-CM

## 2018-06-28 DIAGNOSIS — R079 Chest pain, unspecified: Secondary | ICD-10-CM

## 2018-06-28 DIAGNOSIS — I469 Cardiac arrest, cause unspecified: Secondary | ICD-10-CM

## 2018-06-28 DIAGNOSIS — N179 Acute kidney failure, unspecified: Secondary | ICD-10-CM | POA: Diagnosis present

## 2018-06-28 DIAGNOSIS — E785 Hyperlipidemia, unspecified: Secondary | ICD-10-CM | POA: Diagnosis present

## 2018-06-28 DIAGNOSIS — Z955 Presence of coronary angioplasty implant and graft: Secondary | ICD-10-CM | POA: Diagnosis not present

## 2018-06-28 DIAGNOSIS — E1110 Type 2 diabetes mellitus with ketoacidosis without coma: Secondary | ICD-10-CM | POA: Diagnosis present

## 2018-06-28 DIAGNOSIS — Z4659 Encounter for fitting and adjustment of other gastrointestinal appliance and device: Secondary | ICD-10-CM

## 2018-06-28 DIAGNOSIS — I2510 Atherosclerotic heart disease of native coronary artery without angina pectoris: Secondary | ICD-10-CM | POA: Diagnosis present

## 2018-06-28 DIAGNOSIS — Z79899 Other long term (current) drug therapy: Secondary | ICD-10-CM

## 2018-06-28 DIAGNOSIS — I248 Other forms of acute ischemic heart disease: Secondary | ICD-10-CM | POA: Diagnosis present

## 2018-06-28 DIAGNOSIS — J181 Lobar pneumonia, unspecified organism: Secondary | ICD-10-CM | POA: Diagnosis present

## 2018-06-28 DIAGNOSIS — I25119 Atherosclerotic heart disease of native coronary artery with unspecified angina pectoris: Secondary | ICD-10-CM | POA: Diagnosis not present

## 2018-06-28 DIAGNOSIS — R7989 Other specified abnormal findings of blood chemistry: Secondary | ICD-10-CM

## 2018-06-28 DIAGNOSIS — I5023 Acute on chronic systolic (congestive) heart failure: Secondary | ICD-10-CM

## 2018-06-28 DIAGNOSIS — J189 Pneumonia, unspecified organism: Secondary | ICD-10-CM

## 2018-06-28 DIAGNOSIS — I236 Thrombosis of atrium, auricular appendage, and ventricle as current complications following acute myocardial infarction: Secondary | ICD-10-CM | POA: Diagnosis not present

## 2018-06-28 DIAGNOSIS — F17210 Nicotine dependence, cigarettes, uncomplicated: Secondary | ICD-10-CM | POA: Diagnosis present

## 2018-06-28 DIAGNOSIS — Z765 Malingerer [conscious simulation]: Secondary | ICD-10-CM

## 2018-06-28 DIAGNOSIS — I959 Hypotension, unspecified: Secondary | ICD-10-CM | POA: Diagnosis not present

## 2018-06-28 DIAGNOSIS — J9690 Respiratory failure, unspecified, unspecified whether with hypoxia or hypercapnia: Secondary | ICD-10-CM

## 2018-06-28 DIAGNOSIS — I255 Ischemic cardiomyopathy: Secondary | ICD-10-CM | POA: Diagnosis not present

## 2018-06-28 DIAGNOSIS — Z7901 Long term (current) use of anticoagulants: Secondary | ICD-10-CM

## 2018-06-28 DIAGNOSIS — Z9114 Patient's other noncompliance with medication regimen: Secondary | ICD-10-CM

## 2018-06-28 DIAGNOSIS — I110 Hypertensive heart disease with heart failure: Secondary | ICD-10-CM | POA: Diagnosis present

## 2018-06-28 DIAGNOSIS — J810 Acute pulmonary edema: Secondary | ICD-10-CM | POA: Diagnosis not present

## 2018-06-28 DIAGNOSIS — Z9119 Patient's noncompliance with other medical treatment and regimen: Secondary | ICD-10-CM | POA: Diagnosis not present

## 2018-06-28 DIAGNOSIS — I214 Non-ST elevation (NSTEMI) myocardial infarction: Secondary | ICD-10-CM | POA: Diagnosis not present

## 2018-06-28 DIAGNOSIS — Z7982 Long term (current) use of aspirin: Secondary | ICD-10-CM

## 2018-06-28 DIAGNOSIS — I252 Old myocardial infarction: Secondary | ICD-10-CM | POA: Diagnosis not present

## 2018-06-28 DIAGNOSIS — Z95810 Presence of automatic (implantable) cardiac defibrillator: Secondary | ICD-10-CM

## 2018-06-28 DIAGNOSIS — R402433 Glasgow coma scale score 3-8, at hospital admission: Secondary | ICD-10-CM | POA: Diagnosis present

## 2018-06-28 DIAGNOSIS — Z794 Long term (current) use of insulin: Secondary | ICD-10-CM

## 2018-06-28 DIAGNOSIS — J9601 Acute respiratory failure with hypoxia: Secondary | ICD-10-CM | POA: Diagnosis not present

## 2018-06-28 DIAGNOSIS — R739 Hyperglycemia, unspecified: Secondary | ICD-10-CM | POA: Diagnosis not present

## 2018-06-28 DIAGNOSIS — I5022 Chronic systolic (congestive) heart failure: Secondary | ICD-10-CM | POA: Diagnosis not present

## 2018-06-28 DIAGNOSIS — I9589 Other hypotension: Secondary | ICD-10-CM | POA: Diagnosis present

## 2018-06-28 DIAGNOSIS — R0602 Shortness of breath: Secondary | ICD-10-CM | POA: Diagnosis present

## 2018-06-28 DIAGNOSIS — R778 Other specified abnormalities of plasma proteins: Secondary | ICD-10-CM

## 2018-06-28 DIAGNOSIS — R570 Cardiogenic shock: Secondary | ICD-10-CM | POA: Diagnosis present

## 2018-06-28 DIAGNOSIS — J9600 Acute respiratory failure, unspecified whether with hypoxia or hypercapnia: Secondary | ICD-10-CM | POA: Diagnosis present

## 2018-06-28 LAB — MAGNESIUM: Magnesium: 2.1 mg/dL (ref 1.7–2.4)

## 2018-06-28 LAB — CBC
HCT: 41.2 % (ref 40.0–52.0)
Hemoglobin: 13.5 g/dL (ref 13.0–18.0)
MCH: 35.1 pg — ABNORMAL HIGH (ref 26.0–34.0)
MCHC: 32.7 g/dL (ref 32.0–36.0)
MCV: 107.6 fL — ABNORMAL HIGH (ref 80.0–100.0)
Platelets: 328 K/uL (ref 150–440)
RBC: 3.83 MIL/uL — ABNORMAL LOW (ref 4.40–5.90)
RDW: 15.4 % — ABNORMAL HIGH (ref 11.5–14.5)
WBC: 12.9 K/uL — ABNORMAL HIGH (ref 3.8–10.6)

## 2018-06-28 LAB — URINALYSIS, COMPLETE (UACMP) WITH MICROSCOPIC
Bilirubin Urine: NEGATIVE
Hgb urine dipstick: NEGATIVE
KETONES UR: NEGATIVE mg/dL
LEUKOCYTES UA: NEGATIVE
Nitrite: NEGATIVE
PROTEIN: NEGATIVE mg/dL
SQUAMOUS EPITHELIAL / LPF: NONE SEEN (ref 0–5)
Specific Gravity, Urine: 1.028 (ref 1.005–1.030)
pH: 6 (ref 5.0–8.0)

## 2018-06-28 LAB — COMPREHENSIVE METABOLIC PANEL
ALT: 59 U/L — ABNORMAL HIGH (ref 0–44)
ANION GAP: 17 — AB (ref 5–15)
AST: 130 U/L — ABNORMAL HIGH (ref 15–41)
Albumin: 3.2 g/dL — ABNORMAL LOW (ref 3.5–5.0)
Alkaline Phosphatase: 503 U/L — ABNORMAL HIGH (ref 38–126)
BILIRUBIN TOTAL: 1.1 mg/dL (ref 0.3–1.2)
BUN: 20 mg/dL (ref 6–20)
CO2: 19 mmol/L — ABNORMAL LOW (ref 22–32)
Calcium: 8.3 mg/dL — ABNORMAL LOW (ref 8.9–10.3)
Chloride: 90 mmol/L — ABNORMAL LOW (ref 98–111)
Creatinine, Ser: 1.44 mg/dL — ABNORMAL HIGH (ref 0.61–1.24)
GFR, EST NON AFRICAN AMERICAN: 52 mL/min — AB (ref >60)
Glucose, Bld: 961 mg/dL (ref 70–99)
POTASSIUM: 5.1 mmol/L (ref 3.5–5.1)
Sodium: 126 mmol/L — ABNORMAL LOW (ref 135–145)
TOTAL PROTEIN: 6 g/dL — AB (ref 6.5–8.1)

## 2018-06-28 LAB — LACTIC ACID, PLASMA: Lactic Acid, Venous: 8.1 mmol/L (ref 0.5–1.9)

## 2018-06-28 LAB — URINE DRUG SCREEN, QUALITATIVE (ARMC ONLY)
Amphetamines, Ur Screen: NOT DETECTED
BARBITURATES, UR SCREEN: NOT DETECTED
BENZODIAZEPINE, UR SCRN: NOT DETECTED
Cannabinoid 50 Ng, Ur ~~LOC~~: NOT DETECTED
Cocaine Metabolite,Ur ~~LOC~~: POSITIVE — AB
MDMA (Ecstasy)Ur Screen: NOT DETECTED
METHADONE SCREEN, URINE: NOT DETECTED
OPIATE, UR SCREEN: NOT DETECTED
Phencyclidine (PCP) Ur S: NOT DETECTED
Tricyclic, Ur Screen: NOT DETECTED

## 2018-06-28 LAB — TROPONIN I: TROPONIN I: 4.85 ng/mL — AB (ref <0.03)

## 2018-06-28 LAB — BRAIN NATRIURETIC PEPTIDE: B Natriuretic Peptide: 2095 pg/mL — ABNORMAL HIGH (ref 0.0–100.0)

## 2018-06-28 LAB — HEPARIN LEVEL (UNFRACTIONATED)

## 2018-06-28 LAB — GLUCOSE, CAPILLARY
Glucose-Capillary: >600 mg/dL (ref 70–99)
Glucose-Capillary: >600 mg/dL (ref 70–99)
Glucose-Capillary: >600 mg/dL (ref 70–99)

## 2018-06-28 LAB — APTT: aPTT: <24 s — ABNORMAL LOW (ref 24–36)

## 2018-06-28 LAB — PROTIME-INR
INR: 1.07
Prothrombin Time: 13.8 s (ref 11.4–15.2)

## 2018-06-28 MED ORDER — EPINEPHRINE PF 1 MG/10ML IJ SOSY
PREFILLED_SYRINGE | INTRAMUSCULAR | Status: AC | PRN
  Administered 2018-06-28: 1 via INTRAVENOUS

## 2018-06-28 MED ORDER — IPRATROPIUM-ALBUTEROL 0.5-2.5 (3) MG/3ML IN SOLN
3.0000 mL | Freq: Four times a day (QID) | RESPIRATORY_TRACT | Status: DC
  Administered 2018-06-29 (×2): 3 mL via RESPIRATORY_TRACT
  Filled 2018-06-28 (×2): qty 3

## 2018-06-28 MED ORDER — SODIUM CHLORIDE 0.9 % IV BOLUS
250.0000 mL | Freq: Once | INTRAVENOUS | Status: AC
  Administered 2018-06-28: 250 mL via INTRAVENOUS

## 2018-06-28 MED ORDER — FENTANYL 2500MCG IN NS 250ML (10MCG/ML) PREMIX INFUSION
0.0000 ug/h | INTRAVENOUS | Status: DC
  Administered 2018-06-28: 50 ug/h via INTRAVENOUS
  Administered 2018-06-29: 400 ug/h via INTRAVENOUS
  Filled 2018-06-28 (×2): qty 250

## 2018-06-28 MED ORDER — AMIODARONE HCL IN DEXTROSE 360-4.14 MG/200ML-% IV SOLN
30.0000 mg/h | INTRAVENOUS | Status: DC
  Administered 2018-06-29: 30 mg/h via INTRAVENOUS
  Filled 2018-06-28: qty 200

## 2018-06-28 MED ORDER — LORAZEPAM 2 MG/ML IJ SOLN
2.0000 mg | Freq: Once | INTRAMUSCULAR | Status: AC
  Administered 2018-06-28: 2 mg via INTRAVENOUS

## 2018-06-28 MED ORDER — ONDANSETRON HCL 4 MG PO TABS: 4.0000 mg | ORAL_TABLET | Freq: Four times a day (QID) | ORAL | Status: DC | PRN

## 2018-06-28 MED ORDER — HEPARIN BOLUS VIA INFUSION
3600.0000 [IU] | Freq: Once | INTRAVENOUS | Status: AC
  Administered 2018-06-29: 3600 [IU] via INTRAVENOUS
  Filled 2018-06-28: qty 3600

## 2018-06-28 MED ORDER — DOPAMINE-DEXTROSE 3.2-5 MG/ML-% IV SOLN
INTRAVENOUS | Status: AC
  Administered 2018-06-28: 5 ug/kg/min via INTRAVENOUS
  Filled 2018-06-28: qty 250

## 2018-06-28 MED ORDER — ETOMIDATE 2 MG/ML IV SOLN
INTRAVENOUS | Status: AC | PRN
  Administered 2018-06-28: 20 mg via INTRAVENOUS

## 2018-06-28 MED ORDER — METOPROLOL TARTRATE 5 MG/5ML IV SOLN: 2.5000 mg | Freq: Four times a day (QID) | INTRAVENOUS | Status: DC

## 2018-06-28 MED ORDER — INSULIN GLARGINE 100 UNIT/ML ~~LOC~~ SOLN
35.0000 [IU] | Freq: Every day | SUBCUTANEOUS | Status: DC
  Administered 2018-06-28: 35 [IU] via SUBCUTANEOUS
  Filled 2018-06-28 (×2): qty 0.35

## 2018-06-28 MED ORDER — PROPOFOL 1000 MG/100ML IV EMUL
5.0000 ug/kg/min | Freq: Once | INTRAVENOUS | Status: AC
  Administered 2018-06-28: 10 ug/kg/min via INTRAVENOUS

## 2018-06-28 MED ORDER — SUCCINYLCHOLINE CHLORIDE 20 MG/ML IJ SOLN
INTRAMUSCULAR | Status: AC | PRN
  Administered 2018-06-28: 100 mg via INTRAVENOUS

## 2018-06-28 MED ORDER — SODIUM CHLORIDE 0.9 % IV SOLN
1000.0000 mL | Freq: Once | INTRAVENOUS | Status: AC
  Administered 2018-06-28: 1000 mL via INTRAVENOUS

## 2018-06-28 MED ORDER — AMIODARONE LOAD VIA INFUSION
150.0000 mg | Freq: Once | INTRAVENOUS | Status: AC
  Administered 2018-06-28: 150 mg via INTRAVENOUS
  Filled 2018-06-28: qty 83.34

## 2018-06-28 MED ORDER — MAGNESIUM SULFATE 50 % IJ SOLN
INTRAMUSCULAR | Status: AC | PRN
  Administered 2018-06-28: 2 g via INTRAVENOUS

## 2018-06-28 MED ORDER — INSULIN ASPART 100 UNIT/ML ~~LOC~~ SOLN: 0.0000 [IU] | Freq: Every day | SUBCUTANEOUS | Status: DC

## 2018-06-28 MED ORDER — SODIUM CHLORIDE 0.9 % IV SOLN
INTRAVENOUS | Status: DC
  Administered 2018-06-29 (×2): via INTRAVENOUS

## 2018-06-28 MED ORDER — DOCUSATE SODIUM 100 MG PO CAPS: 100.0000 mg | ORAL_CAPSULE | Freq: Two times a day (BID) | ORAL | Status: DC

## 2018-06-28 MED ORDER — NOREPINEPHRINE 4 MG/250ML-% IV SOLN: 0.0000 ug/min | INTRAVENOUS | Status: DC

## 2018-06-28 MED ORDER — PROPOFOL 1000 MG/100ML IV EMUL
INTRAVENOUS | Status: AC
  Administered 2018-06-28: 10 ug/kg/min via INTRAVENOUS
  Filled 2018-06-28: qty 100

## 2018-06-28 MED ORDER — LORAZEPAM 2 MG/ML IJ SOLN
INTRAMUSCULAR | Status: AC
  Administered 2018-06-28: 1 mg via INTRAVENOUS
  Filled 2018-06-28: qty 1

## 2018-06-28 MED ORDER — AMIODARONE IV BOLUS ONLY 150 MG/100ML
INTRAVENOUS | Status: AC
  Filled 2018-06-28: qty 100

## 2018-06-28 MED ORDER — LORAZEPAM 2 MG/ML IJ SOLN
INTRAMUSCULAR | Status: AC
  Administered 2018-06-28: 2 mg via INTRAVENOUS
  Filled 2018-06-28: qty 1

## 2018-06-28 MED ORDER — IPRATROPIUM-ALBUTEROL 0.5-2.5 (3) MG/3ML IN SOLN
3.0000 mL | Freq: Once | RESPIRATORY_TRACT | Status: AC
  Administered 2018-06-28: 3 mL via RESPIRATORY_TRACT
  Filled 2018-06-28: qty 3

## 2018-06-28 MED ORDER — MIDAZOLAM HCL 2 MG/2ML IJ SOLN
4.0000 mg | Freq: Once | INTRAMUSCULAR | Status: AC
  Administered 2018-06-28: 4 mg via INTRAVENOUS

## 2018-06-28 MED ORDER — SODIUM CHLORIDE 0.9 % IV SOLN
INTRAVENOUS | Status: DC
  Administered 2018-06-28: 10.8 [IU]/h via INTRAVENOUS
  Administered 2018-06-28: 5.4 [IU]/h via INTRAVENOUS
  Administered 2018-06-29: 12.3 [IU]/h via INTRAVENOUS
  Filled 2018-06-28 (×2): qty 1

## 2018-06-28 MED ORDER — LORAZEPAM 2 MG/ML IJ SOLN
1.0000 mg | Freq: Once | INTRAMUSCULAR | Status: AC
  Administered 2018-06-28: 1 mg via INTRAVENOUS

## 2018-06-28 MED ORDER — ACETAMINOPHEN 650 MG RE SUPP: 650.0000 mg | Freq: Four times a day (QID) | RECTAL | Status: DC | PRN

## 2018-06-28 MED ORDER — AMIODARONE HCL IN DEXTROSE 360-4.14 MG/200ML-% IV SOLN
60.0000 mg/h | INTRAVENOUS | Status: AC
  Administered 2018-06-28: 60 mg/h via INTRAVENOUS
  Filled 2018-06-28 (×2): qty 200

## 2018-06-28 MED ORDER — ONDANSETRON HCL 4 MG/2ML IJ SOLN: 4.0000 mg | Freq: Four times a day (QID) | INTRAMUSCULAR | Status: DC | PRN

## 2018-06-28 MED ORDER — ACETAMINOPHEN 325 MG PO TABS: 650.0000 mg | ORAL_TABLET | Freq: Four times a day (QID) | ORAL | Status: DC | PRN

## 2018-06-28 MED ORDER — HEPARIN (PORCINE) IN NACL 100-0.45 UNIT/ML-% IJ SOLN
1150.0000 [IU]/h | INTRAMUSCULAR | Status: DC
  Administered 2018-06-29: 900 [IU]/h via INTRAVENOUS
  Filled 2018-06-28 (×2): qty 250

## 2018-06-28 MED ORDER — LEVOFLOXACIN IN D5W 750 MG/150ML IV SOLN
750.0000 mg | Freq: Once | INTRAVENOUS | Status: AC
  Administered 2018-06-28: 750 mg via INTRAVENOUS
  Filled 2018-06-28: qty 150

## 2018-06-28 MED ORDER — INSULIN ASPART 100 UNIT/ML ~~LOC~~ SOLN: 0.0000 [IU] | Freq: Three times a day (TID) | SUBCUTANEOUS | Status: DC

## 2018-06-28 MED ORDER — AMIODARONE HCL IN DEXTROSE 360-4.14 MG/200ML-% IV SOLN
INTRAVENOUS | Status: AC
  Filled 2018-06-28: qty 200

## 2018-06-28 MED ORDER — DOPAMINE-DEXTROSE 3.2-5 MG/ML-% IV SOLN
0.0000 ug/kg/min | INTRAVENOUS | Status: DC
  Administered 2018-06-28: 5 ug/kg/min via INTRAVENOUS

## 2018-06-28 MED ORDER — MIDAZOLAM HCL 2 MG/2ML IJ SOLN
INTRAMUSCULAR | Status: AC
  Filled 2018-06-28: qty 4

## 2018-06-28 MED ORDER — HYDROCODONE-ACETAMINOPHEN 5-325 MG PO TABS: 1.0000 | ORAL_TABLET | ORAL | Status: DC | PRN

## 2018-06-28 MED ORDER — BISACODYL 5 MG PO TBEC: 5.0000 mg | DELAYED_RELEASE_TABLET | Freq: Every day | ORAL | Status: DC | PRN

## 2018-06-28 NOTE — ED Notes (Signed)
CRITICAL LAB: LACTIC is 6.8, [facility], Dr. Cyril Loosen notified, orders received

## 2018-06-28 NOTE — ED Notes (Signed)
Pt biting ett, attempted to RT

## 2018-06-28 NOTE — ED Triage Notes (Signed)
Patient presents to Emergency Department via EMS with complaints of mid-chest heaviness and pressure that started about one hour ago.    Pt reports cocaine use 4 days ago and prior MIs with defib/pacer installed in 15-Feb-2015, "my LAD is dead, I got 2 stents and 4 blockages"  Pt is clammy and diaphoretic, per EMS CBG read "high".  EMS reports that defib went off twice and pt reports defib in room as well

## 2018-06-28 NOTE — ED Notes (Signed)
defib

## 2018-06-28 NOTE — ED Notes (Signed)
Second call to RT, unable to transfer pt att

## 2018-06-28 NOTE — ED Notes (Signed)
2 x defib, pt LOC, kinner in room

## 2018-06-28 NOTE — ED Notes (Addendum)
2 x defib att

## 2018-06-28 NOTE — ED Notes (Addendum)
CRITICAL LAB: TROPONIN is 4.85, GLUCOSE 961 LACTIC 8.1 Lab notified charge, Dr. Cyril Loosen notified, orders received

## 2018-06-28 NOTE — ED Notes (Signed)
defib att  2 x 2016  1 x 2020

## 2018-06-28 NOTE — H&P (Signed)
[facility] [place] at [facility]   PATIENT NAME: Mario Gallagher    MR#:  [id number]  DATE OF BIRTH:  22-Mar-1961  DATE OF ADMISSION:  06/18/2018  PRIMARY CARE PHYSICIAN: System, Pcp Not In   REQUESTING/REFERRING PHYSICIAN:   CHIEF COMPLAINT:   Chief Complaint  Patient presents with  . Chest Pain  . Abdominal Pain    HISTORY OF PRESENT ILLNESS: Mario Gallagher  is a 57 y.o. 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 Patient is currently intubated, status post cardiac arrest, unable to provide any history.  Information was taken from reviewing the medical chart and from discussion with emergency room physician. Patient presented to the hospital for repeated AICD firing and chest pain, going on for the past hour before presenting to emergency room.  Device interrogation report showed 21 AICD firing episodes from 7 PM to 8 PM. He is known for noncompliance with his medications.  The blood sugar was elevated at 961 in the emergency room. Other labs done in the emergency room showed anion gap at 17, CO2 of 19, pH of 7.24.  WBC is elevated at 12.9.  Sodium is 126.  Creatinine is 1.44.  AST is 130 and ALT is 59. Urine drug screen test is positive for cocaine. Chest x-ray shows cardiomegaly with mild interstitial edema and small left pleural effusion. Mild patchy right lower lobe opacity, worrisome for pneumonia. Patient is admitted to intensive care unit for further management.   PAST MEDICAL HISTORY:   Past Medical History:  Diagnosis Date  . Acute MI (HCC)   . CHF (congestive heart failure) (HCC)   . Diabetes mellitus without complication (HCC)   . H/O blood clots   . Hyperlipemia   . Hypertension   . Left kidney mass     PAST SURGICAL HISTORY:  Past Surgical  History:  Procedure Laterality Date  . CARDIAC DEFIBRILLATOR PLACEMENT    . HERNIA REPAIR      SOCIAL HISTORY:  Social History   Tobacco Use  . Smoking status: Current Every Day Smoker    Packs/day: 1.00    Types: Cigarettes  . Smokeless tobacco: Never Used  Substance Use Topics  . Alcohol use: No    FAMILY HISTORY:  Family History  Problem Relation Age of Onset  . Heart disease Father   . Hypertension Brother   . Hyperlipidemia Brother   . Rheumatic fever Mother   . Mitral valve prolapse Mother     DRUG ALLERGIES:  Allergies  Allergen Reactions  . Codeine Swelling  . Fish Oil Hives  . Penicillins Swelling    Has patient had a PCN reaction causing immediate rash, facial/tongue/throat swelling, SOB or lightheadedness with hypotension: Yes Has patient had a PCN reaction causing severe rash involving mucus membranes or skin necrosis: No Has patient had a PCN reaction that required hospitalization: No Has patient had a PCN reaction occurring within the last 10 years: No If all of the above answers are "NO", then may proceed with Cephalosporin use.  . Simvastatin Hives  . Tramadol Swelling  . Tylenol [Acetaminophen] Other (See Comments)    "it messes with my liver and my stomach"  pain    REVIEW OF SYSTEMS:   Unable to obtain due to patient being sedated, intubated.  MEDICATIONS AT HOME:  Prior to Admission medications   Medication Sig Start Date End Date Taking? Authorizing Provider  albuterol (PROVENTIL HFA;VENTOLIN HFA) 108 (90 Base) MCG/ACT inhaler Inhale 2 puffs into the lungs every 6 (six) hours as needed for wheezing or shortness of breath. 06/23/18   Ihor Austin, MD  amiodarone (PACERONE) 400 MG tablet Take 1 tablet (400 mg total) by mouth daily. Patient taking differently: Take 200 mg by mouth daily.  05/21/18   Mayo, Allyn Kenner, MD  apixaban (ELIQUIS) 5 MG TABS tablet Take 5 mg by mouth 2 (two) times daily.    [provider]  aspirin 81 MG  chewable tablet Chew 1 tablet (81 mg total) by mouth daily. 05/21/18   Mayo, Allyn Kenner, MD  carvedilol (COREG) 3.125 MG tablet Take 4 tablets (12.5 mg total) by mouth 2 (two) times daily with a meal. 06/20/18   Enedina Finner, MD  clonazePAM (KLONOPIN) 0.5 MG tablet Take 0.5 mg by mouth 2 (two) times daily as needed for anxiety.     [provider]  furosemide (LASIX) 20 MG tablet Take 1 tablet (20 mg total) by mouth daily. 05/21/18   Mayo, Allyn Kenner, MD  gabapentin (NEURONTIN) 800 MG tablet Take 800 mg by mouth 2 (two) times daily.     [provider]  insulin NPH Human (HUMULIN N,NOVOLIN N) 100 UNIT/ML injection Inject 25 Units into the skin 2 (two) times daily.    [provider]  insulin regular (NOVOLIN R,HUMULIN R) 100 units/mL injection Inject 2-8 Units into the skin 3 (three) times daily. Per sliding scale    [provider]  isosorbide mononitrate (IMDUR) 30 MG 24 hr tablet Take 0.5 tablets (15 mg total) by mouth daily. 05/21/18   Mayo, Allyn Kenner, MD  levofloxacin (LEVAQUIN) 500 MG tablet Take 1 tablet (500 mg total) by mouth daily for 5 days. 06/23/18 06/22/2018  Ihor Austin, MD  lisinopril (PRINIVIL,ZESTRIL) 5 MG tablet Take 1 tablet (5 mg total) by mouth daily. 05/21/18   Mayo, Allyn Kenner, MD  metFORMIN (GLUCOPHAGE) 500 MG tablet Take 1 tablet (500 mg total) by mouth 2 (two) times daily with a meal. 05/20/18 06/22/18  Mayo, Allyn Kenner, MD  metFORMIN (GLUCOPHAGE) 500 MG tablet Take 500 mg by mouth 2 (two) times daily with a meal.    [provider]  oxyCODONE-acetaminophen (PERCOCET) 10-325 MG tablet Take 1 tablet by mouth every 6 (six) hours as needed for pain. 06/23/18   Ihor Austin, MD  rosuvastatin (CRESTOR) 20 MG tablet Take 1 tablet (20 mg total) by mouth daily at 6 PM. Patient taking differently: Take 20 mg by mouth at bedtime.  05/20/18   Mayo, Allyn Kenner, MD  spironolactone (ALDACTONE) 25 MG tablet Take 0.5 tablets (12.5 mg total) by mouth daily.  05/20/18   Mayo, Allyn Kenner, MD      PHYSICAL EXAMINATION:   VITAL SIGNS: Blood pressure (!) 86/56, pulse 71, temperature (!) 96.8 F (36 C), temperature source Oral, resp. rate (!) 22, height 5\' 8"  (1.727 m), weight 58.5 kg, SpO2 95 %.  GENERAL:  57 y.o.-year-old patient lying in the bed, unresponsive, intubated.  He looks chronically ill, thin. EYES:  No scleral icterus. HEENT: Head atraumatic, normocephalic.  Endotracheal tube in place. NECK:  Supple, no jugular venous distention.  LUNGS: Breathing is supported by ventilator machine.  Coarse breath sounds bilaterally, no wheezing, rales,rhonchi. No use of accessory muscles of respiration.  CARDIOVASCULAR: S1, S2 normal. No S3/S4.  ABDOMEN: Soft, nondistended. Bowel sounds  present. No organomegaly or mass.  EXTREMITIES: No pedal edema, cyanosis, or clubbing.  NEUROLOGIC: Unable to perform neurologic exam due to patient being unresponsive. PSYCHIATRIC: The patient is sedated, unresponsive.  SKIN: No obvious rash, lesion, or ulcer.   LABORATORY PANEL:   CBC Recent Labs  Lab 06/22/18 0721 06/22/18 2304 06/23/18 0716 06/25/18 0456 07/07/2018 1911  WBC 11.4* 11.5* 14.3* 11.7* 12.9*  HGB 14.1 13.2 13.2 13.6 13.5  HCT 40.7 37.8* 38.0* 39.2* 41.2  PLT 211 223 207 269 328  MCV 101.3* 99.6 98.9 100.8* 107.6*  MCH 35.1* 34.9* 34.5* 34.9* 35.1*  MCHC 34.7 35.0 34.8 34.6 32.7  RDW 14.0 13.9 14.1 14.2 15.4*   ------------------------------------------------------------------------------------------------------------------  Chemistries  Recent Labs  Lab 06/22/18 0721 06/22/18 2304 06/25/18 0456 07/07/2018 1911  NA 129* 126* 128* 126*  K 5.2* 4.6 5.1 5.1  CL 89* 88* 90* 90*  CO2 23 29 27  19*  GLUCOSE 419* 520* 512* 961*  BUN 24* 34* 25* 20  CREATININE 1.12 0.97 1.11 1.44*  CALCIUM 9.1 8.8* 8.9 8.3*  MG  --   --   --  2.1  AST  --   --   --  130*  ALT  --   --   --  59*  ALKPHOS  --   --   --  503*  BILITOT  --   --   --  1.1    ------------------------------------------------------------------------------------------------------------------ estimated creatinine clearance is 46.3 mL/min (A) (by C-G formula based on SCr of 1.44 mg/dL (H)). ------------------------------------------------------------------------------------------------------------------ No results for input(s): TSH, T4TOTAL, T3FREE, THYROIDAB in the last 72 hours.  Invalid input(s): FREET3   Coagulation profile Recent Labs  Lab 06/22/18 0722 06/25/18 0725 July 07, 2018 1911  INR 1.02 1.07 1.07   ------------------------------------------------------------------------------------------------------------------- No results for input(s): DDIMER in the last 72 hours. -------------------------------------------------------------------------------------------------------------------  Cardiac Enzymes Recent Labs  Lab 06/25/18 1456 06/25/18 2221 07/07/2018 1911  TROPONINI 4.19* 5.37* 4.85*   ------------------------------------------------------------------------------------------------------------------ Invalid input(s): POCBNP  ---------------------------------------------------------------------------------------------------------------  Urinalysis    Component Value Date/Time   COLORURINE YELLOW (A) 06/22/2018 0722   APPEARANCEUR CLEAR (A) 06/22/2018 0722   LABSPEC 1.027 06/22/2018 0722   PHURINE 5.0 06/22/2018 0722   GLUCOSEU >=500 (A) 06/22/2018 0722   HGBUR NEGATIVE 06/22/2018 0722   BILIRUBINUR NEGATIVE 06/22/2018 0722   KETONESUR 5 (A) 06/22/2018 0722   PROTEINUR NEGATIVE 06/22/2018 0722   NITRITE NEGATIVE 06/22/2018 0722   LEUKOCYTESUR NEGATIVE 06/22/2018 0722     RADIOLOGY: Dg Chest Portable 1 View  Result Date: 07-Jul-2018 CLINICAL DATA:  Chest heaviness/pressure EXAM: PORTABLE CHEST 1 VIEW COMPARISON:  06/26/2018 FINDINGS: Cardiomegaly with mild interstitial edema. Mild patchy right lower lobe opacity, worrisome for  pneumonia. Left basilar opacity, likely atelectasis. Small left pleural effusion. Left subclavian ICD. IMPRESSION: Cardiomegaly with mild interstitial edema and small left pleural effusion. Mild patchy right lower lobe opacity, worrisome for pneumonia. Left basilar opacity, likely atelectasis. Electronically Signed   By: Charline Bills M.D.   On: Jul 07, 2018 19:27    EKG: Orders placed or performed during the hospital encounter of 2018-07-07  . ED EKG  . ED EKG  . EKG 12-Lead  . EKG 12-Lead  . ED EKG within 10 minutes  . ED EKG within 10 minutes  . EKG 12-Lead  . EKG 12-Lead    IMPRESSION AND PLAN:  1.  VT storm, likely multifactorial, secondary to severe systolic heart failure, ischemic cardiomyopathy, cocaine use, DKA and electrolyte abnormalities.  We will continue amiodarone drip and beta-blockers if  blood pressure allows. Cardiology is consulted for further evaluation and treatment.  ICD was just interrogated and found to be appropriately functioning.  2.  Cardiac arrest, likely secondary to VT and severe ischemic cardiomyopathy while abusing cocaine.  EKG is currently showing ventricular pacing and fusion complexes.  We will continue to monitor patient clinically closely in intensive care unit.  We will maintain sedation and vent support.  3.  DKA, will start treatment with IV insulin and gentle IV hydration.  Continue to monitor blood sugar, anion gap and pH level closely.  We will correct electrolyte deficiencies, as needed.  4.  HCAP, will start IV cefepime and vancomycin.  Continue vent support and nebulizer treatment as needed.  5.  Acute respiratory failure, secondary to cardiac arrest, continue vent support and pneumonia treatment.  6.  Elevated troponin level, likely secondary to demand ischemia, patient is receiving 2 disease, but not candidate for CABG, due to his noncompliance with treatments and drug abuse.  Continue heparin drip.  Continue to monitor on telemetry and  follow troponin levels.  Beta-blocker and statin are recommended when patient is able to take them.  7.  Severe coronary artery disease, not candidate for CABG, due to his noncompliance with treatments and drug abuse.  Continue heparin drip.  Continue to monitor on telemetry and follow troponin levels.  Beta-blocker and statin are recommended when patient is able to take them.  8.  Acute on chronic systolic heart failure.  Per most recent 2D echo, ejection fraction is severely reduced at 15%.  Patient is not a candidate for advanced therapies.  Will avoid aggressive fluid resuscitation.  We will plan to restart beta-blocker, lisinopril, Spironolactone when blood pressure allows.  We will avoid propofol for sedation, will use fentanyl IV, instead.  Continue further management per cardiology.  9.  History of LV mural thrombus.  Patient was supposed to be on Eliquis for this, however he has not been compliant with his medications.  Will start heparin drip.  Overall, long-term prognosis is very poor, due to advanced heart failure, medication noncompliance and continues substance abuse.  Palliative care team is consulted for possible hospice enrollment.  All the records are reviewed and case discussed with ED, ICU and Cardiology providers. There is no family available at bedside or over the phone.   CODE STATUS: Full Code Status History    Date Active Date Inactive Code Status Order ID Comments User Context   06/25/2018 0844 06/26/2018 1335 Full Code [id number]  Arnaldo Natal, MD ED   06/22/2018 1021 06/23/2018 1844 Full Code [id number]  Ihor Austin, MD Inpatient   06/19/2018 1450 06/20/2018 1740 Full Code [id number]  Campbell Stall, MD ED   05/19/2018 2120 05/20/2018 1519 Full Code [id number]  Elder Negus, RN Inpatient       TOTAL TIME TAKING CARE OF THIS PATIENT: 75 minutes.    Cammy Copa M.D on 06/01/2018 at 9:41 PM  Between 7am to 6pm - Pager - [phone]  After 6pm go to  [email or web address] - password [email or web address] [facility] [place] [facility]  [phone]  CC: Primary care physician; System, Pcp Not In

## 2018-06-28 NOTE — ED Provider Notes (Addendum)
[facility] Emergency Department Provider Note   ____________________________________________    I have reviewed the triage vital signs and the nursing notes.   HISTORY  Chief Complaint Chest Pain and Abdominal Pain     HPI Mario Gallagher is a 57 y.o. male with a history of CHF, diabetes, severe cardiac disease who presents today because his defibrillator has fired multiple times.  Patient has been to our hospital multiple times over the last several months for cardiac causes, is not a candidate for CABG due to severe noncompliance, polysubstance abuse.  Patient reports he has not been taking anything for diabetes and thinks his glucose may be high.  He reports sternal chest pain as well.  Mild shortness of breath.  No nausea or vomiting.  Has not taken anything for this.   Past Medical History:  Diagnosis Date  . Acute MI (HCC)   . CHF (congestive heart failure) (HCC)   . Diabetes mellitus without complication (HCC)   . H/O blood clots   . Hyperlipemia   . Hypertension   . Left kidney mass     Patient Active Problem List   Diagnosis Date Noted  . Acute respiratory failure (HCC) 06/16/2018  . Cocaine abuse (HCC) 06/25/2018  . CAD in native artery 06/25/2018  . Chronic combined systolic and diastolic CHF (congestive heart failure) (HCC) 06/25/2018  . Mural thrombus of left ventricular apex without MI 06/25/2018  . Chest pain 06/22/2018  . H/O noncompliance with medical treatment, presenting hazards to health 06/19/2018  . Acute on chronic systolic (congestive) heart failure (HCC) 05/19/2018    Past Surgical History:  Procedure Laterality Date  . CARDIAC DEFIBRILLATOR PLACEMENT    . HERNIA REPAIR      Prior to Admission medications   Medication Sig Start Date End Date Taking? Authorizing Provider  albuterol (PROVENTIL HFA;VENTOLIN HFA) 108 (90 Base) MCG/ACT inhaler Inhale 2 puffs into the lungs every 6 (six) hours as needed for wheezing or  shortness of breath. 06/23/18   Ihor Austin, MD  amiodarone (PACERONE) 400 MG tablet Take 1 tablet (400 mg total) by mouth daily. Patient taking differently: Take 200 mg by mouth daily.  05/21/18   Mayo, Allyn Kenner, MD  apixaban (ELIQUIS) 5 MG TABS tablet Take 5 mg by mouth 2 (two) times daily.    [provider]  aspirin 81 MG chewable tablet Chew 1 tablet (81 mg total) by mouth daily. 05/21/18   Mayo, Allyn Kenner, MD  carvedilol (COREG) 3.125 MG tablet Take 4 tablets (12.5 mg total) by mouth 2 (two) times daily with a meal. 06/20/18   Enedina Finner, MD  clonazePAM (KLONOPIN) 0.5 MG tablet Take 0.5 mg by mouth 2 (two) times daily as needed for anxiety.     [provider]  furosemide (LASIX) 20 MG tablet Take 1 tablet (20 mg total) by mouth daily. 05/21/18   Mayo, Allyn Kenner, MD  gabapentin (NEURONTIN) 800 MG tablet Take 800 mg by mouth 2 (two) times daily.     [provider]  insulin NPH Human (HUMULIN N,NOVOLIN N) 100 UNIT/ML injection Inject 25 Units into the skin 2 (two) times daily.    [provider]  insulin regular (NOVOLIN R,HUMULIN R) 100 units/mL injection Inject 2-8 Units into the skin 3 (three) times daily. Per sliding scale    [provider]  isosorbide mononitrate (IMDUR) 30 MG 24 hr tablet Take 0.5 tablets (15 mg total) by mouth daily. 05/21/18   Mayo,  Allyn Kenner, MD  levofloxacin (LEVAQUIN) 500 MG tablet Take 1 tablet (500 mg total) by mouth daily for 5 days. 06/23/18 Jul 22, 2018  Ihor Austin, MD  lisinopril (PRINIVIL,ZESTRIL) 5 MG tablet Take 1 tablet (5 mg total) by mouth daily. 05/21/18   Mayo, Allyn Kenner, MD  metFORMIN (GLUCOPHAGE) 500 MG tablet Take 1 tablet (500 mg total) by mouth 2 (two) times daily with a meal. 05/20/18 06/22/18  Mayo, Allyn Kenner, MD  metFORMIN (GLUCOPHAGE) 500 MG tablet Take 500 mg by mouth 2 (two) times daily with a meal.    [provider]  oxyCODONE-acetaminophen (PERCOCET) 10-325 MG tablet Take 1 tablet by mouth  every 6 (six) hours as needed for pain. 06/23/18   Ihor Austin, MD  rosuvastatin (CRESTOR) 20 MG tablet Take 1 tablet (20 mg total) by mouth daily at 6 PM. Patient taking differently: Take 20 mg by mouth at bedtime.  05/20/18   Mayo, Allyn Kenner, MD  spironolactone (ALDACTONE) 25 MG tablet Take 0.5 tablets (12.5 mg total) by mouth daily. 05/20/18   Mayo, Allyn Kenner, MD     Allergies Codeine; Fish oil; Penicillins; Simvastatin; Tramadol; and Tylenol [acetaminophen]  Family History  Problem Relation Age of Onset  . Heart disease Father   . Hypertension Brother   . Hyperlipidemia Brother   . Rheumatic fever Mother   . Mitral valve prolapse Mother     Social History Social History   Tobacco Use  . Smoking status: Current Every Day Smoker    Packs/day: 1.00    Types: Cigarettes  . Smokeless tobacco: Never Used  Substance Use Topics  . Alcohol use: No  . Drug use: Yes    Types: Cocaine    Comment: smoked on 06/24/2018    Review of Systems  Constitutional: No fever/chills Eyes: No visual changes.  ENT: No sore throat. Cardiovascular: As above Respiratory: As above Gastrointestinal: No abdominal pain.   Genitourinary: Negative for dysuria. Musculoskeletal: Negative for back pain. Skin: Negative for rash. Neurological: Negative for headaches    ____________________________________________   PHYSICAL EXAM:  VITAL SIGNS: ED Triage Vitals  Enc Vitals Group     BP 2018/07/22 1913 137/87     Pulse Rate 07/22/2018 1913 (!) 118     Resp 07-22-2018 1913 (!) 37     Temp 22-Jul-2018 1913 (!) 96.8 F (36 C)     Temp Source 07-22-18 1913 Oral     SpO2 22-Jul-2018 1913 91 %     Weight Jul 22, 2018 1949 58.5 kg (129 lb)     Height 2018-07-22 1949 1.727 m (5\' 8" )     Head Circumference --      Peak Flow --      Pain Score 07-22-18 1915 10     Pain Loc --      Pain Edu? --      Excl. in GC? --     Constitutional: Alert and oriented.  Anxious Eyes: Conjunctivae are normal.  . Nose: No  congestion/rhinnorhea. Mouth/Throat: Mucous membranes are moist.    Cardiovascular: Tachycardia, regular rhythm. Grossly normal heart sounds.  Good peripheral circulation. Respiratory: Mild tachypnea no retractions.  Scattered mild wheezes Gastrointestinal: Soft and nontender. No distention.   Musculoskeletal:  Warm and well perfused Neurologic:  Normal speech and language. No gross focal neurologic deficits are appreciated.  Skin:  Skin is warm, dry and intact. No rash noted. Psychiatric: Mood and affect are normal. Speech and behavior are normal.  ____________________________________________   LABS (all labs ordered are  listed, but only abnormal results are displayed)  Labs Reviewed  APTT - Abnormal; Notable for the following components:      Result Value   aPTT <24 (*)    All other components within normal limits  CBC - Abnormal; Notable for the following components:   WBC 12.9 (*)    RBC 3.83 (*)    MCV 107.6 (*)    MCH 35.1 (*)    RDW 15.4 (*)    All other components within normal limits  COMPREHENSIVE METABOLIC PANEL - Abnormal; Notable for the following components:   Sodium 126 (*)    Chloride 90 (*)    CO2 19 (*)    Glucose, Bld 961 (*)    Creatinine, Ser 1.44 (*)    Calcium 8.3 (*)    Total Protein 6.0 (*)    Albumin 3.2 (*)    AST 130 (*)    ALT 59 (*)    Alkaline Phosphatase 503 (*)    GFR calc non Af Amer 52 (*)    Anion gap 17 (*)    All other components within normal limits  TROPONIN I - Abnormal; Notable for the following components:   Troponin I 4.85 (*)    All other components within normal limits  BLOOD GAS, VENOUS - Abnormal; Notable for the following components:   pH, Ven 7.24 (*)    Acid-base deficit 6.6 (*)    All other components within normal limits  LACTIC ACID, PLASMA - Abnormal; Notable for the following components:   Lactic Acid, Venous 8.1 (*)    All other components within normal limits  BRAIN NATRIURETIC PEPTIDE - Abnormal; Notable  for the following components:   B Natriuretic Peptide 2,095.0 (*)    All other components within normal limits  GLUCOSE, CAPILLARY - Abnormal; Notable for the following components:   Glucose-Capillary >600 (*)    All other components within normal limits  GLUCOSE, CAPILLARY - Abnormal; Notable for the following components:   Glucose-Capillary >600 (*)    All other components within normal limits  URINALYSIS, COMPLETE (UACMP) WITH MICROSCOPIC - Abnormal; Notable for the following components:   Color, Urine STRAW (*)    APPearance CLEAR (*)    Glucose, UA >=500 (*)    Bacteria, UA RARE (*)    All other components within normal limits  GLUCOSE, CAPILLARY - Abnormal; Notable for the following components:   Glucose-Capillary >600 (*)    All other components within normal limits  CULTURE, BLOOD (ROUTINE X 2)  CULTURE, BLOOD (ROUTINE X 2)  PROTIME-INR  MAGNESIUM  LACTIC ACID, PLASMA  URINE DRUG SCREEN, QUALITATIVE ([facility] ONLY)   ____________________________________________  EKG  ED ECG REPORT I, Jene Every, the attending physician, personally viewed and interpreted this ECG.  Date: 07-16-18  Rhythm: Paced rhythm, tachycardia QRS Axis: Abnormal Intervals: Abnormal ST/T Wave abnormalities: Abnormal   ____________________________________________  RADIOLOGY  cxr concerning for pneumonia ____________________________________________   PROCEDURES  Procedure(s) performed: No  .Central Line Date/Time: 16-Jul-2018 9:49 PM Performed by: Jene Every, MD Authorized by: Jene Every, MD   Consent:    Consent obtained:  Emergent situation Pre-procedure details:    Hand hygiene: Hand hygiene performed prior to insertion     Sterile barrier technique: All elements of maximal sterile technique followed     Skin preparation:  2% chlorhexidine and Betadine   Skin preparation agent: Skin preparation agent completely dried prior to procedure   Anesthesia (see MAR for exact  dosages):  Anesthesia method:  Local infiltration Procedure details:    Location:  R internal jugular   Patient position:  Trendelenburg   Procedural supplies:  Triple lumen   Catheter size:  7 Fr   Landmarks identified: yes     Ultrasound guidance: yes     Sterile ultrasound techniques: Sterile gel and sterile probe covers were used     Number of attempts:  2   Successful placement: yes   Post-procedure details:    Post-procedure:  Dressing applied and line sutured   Assessment:  Blood return through all ports and free fluid flow   Patient tolerance of procedure:  Tolerated well, no immediate complications     Critical Care performed: yes  CRITICAL CARE Performed by: Jene Every   Total critical care time: 40 minutes  Critical care time was exclusive of separately billable procedures and treating other patients.  Critical care was necessary to treat or prevent imminent or life-threatening deterioration.  Critical care was time spent personally by me on the following activities: development of treatment plan with patient and/or surrogate as well as nursing, discussions with consultants, evaluation of patient's response to treatment, examination of patient, obtaining history from patient or surrogate, ordering and performing treatments and interventions, ordering and review of laboratory studies, ordering and review of radiographic studies, pulse oximetry and re-evaluation of patient's condition.  ____________________________________________   INITIAL IMPRESSION / ASSESSMENT AND PLAN / ED COURSE  Pertinent labs & imaging results that were available during my care of the patient were reviewed by me and considered in my medical decision making (see chart for details).  Patient presents because his defibrillator keeps firing.  Seen on the monitor that he is having brief runs of ventricular tachycardia.  Amiodarone bolus and infusion ordered.  Notified of critical lab  values including elevated lactic acid, glucose of 961, elevated troponin.  He appears to be in DKA, this is certainly exacerbating his condition.  Insulin drip ordered.  His troponin is trending down from where it was recently.  Chest x-ray is concerning for pneumonia, IV Levaquin ordered, blood cultures ordered  Discussed with Dr. end of cardiology who agrees with management, recommends avoidance of IV fluids given EF of 15%.  Discussed with Dr. Nancy Marus of the hospital service for admission  ----------------------------------------- 8:50 PM on 06/27/2018 -----------------------------------------  Patient's condition worsened, he began to become profusely diaphoretic with multiple defibrillator shocks secondary to V. tach.  Intubated by me with loss of pulses immediately afterwards, ACLS successful after 1 epinephrine.  Central line placed by me.  Blood pressure stable not requiring pressors.    ____________________________________________   FINAL CLINICAL IMPRESSION(S) / ED DIAGNOSES  Final diagnoses:  Community acquired pneumonia of left lower lobe of lung (HCC)  Diabetic ketoacidosis without coma associated with other specified diabetes mellitus (HCC)  Ventricular tachycardia (HCC)  Elevated troponin  Chest pain, unspecified type  Cardiac arrest [facility])        Note:  This document was prepared using Dragon voice recognition software and may include unintentional dictation errors.    Jene Every, MD 06/25/2018 2014    Jene Every, MD 06/26/2018 2151

## 2018-06-28 NOTE — ED Notes (Signed)
Pharmacy called for meds.  

## 2018-06-28 NOTE — Consult Note (Signed)
Cardiology Consultation:   Patient ID: Mario Gallagher MRN: [id number]; DOB: May 05, 1961  Admit date: 2018-07-01 Date of Consult: 2018/07/01  Primary Care Provider: System, Pcp Not In Primary Cardiologist: [facility] Primary Electrophysiologist:  None    Patient Profile:   Mario Gallagher is a 57 y.o. 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 Cyril Loosen.  History of Present Illness:   Mr. Mario Gallagher is currently intubated.  History is obtained from the chart.  He reported has not been taking any medications for his diabetes for several days and is concerned that his glucose was elevated.  He also endorsed substernal chest pain and mild shortness of breath.  In the ED, he was found to be severely hyperglycemic with a glucose of 961, anion gap of 17, CO2 of 19 and pH of 7.24, concerning for DKA or HHS.  While in the ED, the patient experienced several episodes of ventricular tachycardia with ICD firing.  Due to recurrent shocks as well as increasing respiratory distress, the patient was intubated.  Around the time of intubation, the patient reportedly received 15 ICD shocks.  He also had brief cardiac arrest and received epinephrine x1 with ROSC.  The patient has been admitted several times since late August for chest pain, most recently on 06/25/2018.  This was a second admission in a week for chest pain with elevated troponin.  The patient left AGAINST MEDICAL ADVICE on 06/26/2018.  He has previously received his cardiac care at [facility].  Catheterization has not been pursued given the patient's known severe CAD,  noncompliance, drug-seeking behavior, and ongoing cocaine abuse.  Of note, troponin was found to be 4.85 today, down from 5.373 days ago.  Past Medical History:  Diagnosis Date  . Acute MI (HCC)   . CHF (congestive heart failure) (HCC)   . Diabetes mellitus without complication (HCC)   . H/O blood clots   . Hyperlipemia   . Hypertension   . Left kidney mass     Past Surgical History:  Procedure Laterality Date  . CARDIAC DEFIBRILLATOR PLACEMENT    . HERNIA REPAIR       Home Medications:  Prior to Admission medications   Medication Sig Start Date Ebon Ketchum Date Taking? Authorizing Provider  albuterol (PROVENTIL HFA;VENTOLIN HFA) 108 (90 Base) MCG/ACT inhaler Inhale 2 puffs into the lungs every 6 (six) hours as needed for wheezing or shortness of breath. 06/23/18   Ihor Austin, MD  amiodarone (PACERONE) 400 MG tablet Take 1 tablet (400 mg total) by mouth daily. Patient taking differently: Take 200 mg by mouth daily.  05/21/18   Mayo, Allyn Kenner, MD  apixaban (ELIQUIS) 5 MG TABS tablet Take 5 mg by mouth 2 (two) times daily.    [provider]  aspirin 81 MG chewable tablet Chew 1 tablet (81 mg total) by mouth daily. 05/21/18   Mayo, Allyn Kenner, MD  carvedilol (COREG) 3.125 MG tablet Take 4 tablets (12.5 mg total) by mouth 2 (two) times daily with a meal. 06/20/18   Enedina Finner, MD  clonazePAM (KLONOPIN) 0.5 MG tablet Take 0.5  mg by mouth 2 (two) times daily as needed for anxiety.     [provider]  furosemide (LASIX) 20 MG tablet Take 1 tablet (20 mg total) by mouth daily. 05/21/18   Mayo, Allyn Kenner, MD  gabapentin (NEURONTIN) 800 MG tablet Take 800 mg by mouth 2 (two) times daily.     [provider]  insulin NPH Human (HUMULIN N,NOVOLIN N) 100 UNIT/ML injection Inject 25 Units into the skin 2 (two) times daily.    [provider]  insulin regular (NOVOLIN R,HUMULIN R) 100 units/mL injection Inject 2-8 Units into the skin 3 (three) times daily. Per sliding  scale    [provider]  isosorbide mononitrate (IMDUR) 30 MG 24 hr tablet Take 0.5 tablets (15 mg total) by mouth daily. 05/21/18   Mayo, Allyn Kenner, MD  levofloxacin (LEVAQUIN) 500 MG tablet Take 1 tablet (500 mg total) by mouth daily for 5 days. 06/23/18 07/16/18  Ihor Austin, MD  lisinopril (PRINIVIL,ZESTRIL) 5 MG tablet Take 1 tablet (5 mg total) by mouth daily. 05/21/18   Mayo, Allyn Kenner, MD  metFORMIN (GLUCOPHAGE) 500 MG tablet Take 1 tablet (500 mg total) by mouth 2 (two) times daily with a meal. 05/20/18 06/22/18  Mayo, Allyn Kenner, MD  metFORMIN (GLUCOPHAGE) 500 MG tablet Take 500 mg by mouth 2 (two) times daily with a meal.    [provider]  oxyCODONE-acetaminophen (PERCOCET) 10-325 MG tablet Take 1 tablet by mouth every 6 (six) hours as needed for pain. 06/23/18   Ihor Austin, MD  rosuvastatin (CRESTOR) 20 MG tablet Take 1 tablet (20 mg total) by mouth daily at 6 PM. Patient taking differently: Take 20 mg by mouth at bedtime.  05/20/18   Mayo, Allyn Kenner, MD  spironolactone (ALDACTONE) 25 MG tablet Take 0.5 tablets (12.5 mg total) by mouth daily. 05/20/18   Mayo, Allyn Kenner, MD    Inpatient Medications: Scheduled Meds:  Continuous Infusions: . amiodarone 60 mg/hr (07-16-18 1941)   Followed by  . [START ON 06/05/2018] amiodarone    . amiodarone    . insulin (NOVOLIN-R) infusion 5.4 mL/hr at 07-16-18 2122  . sodium chloride 250 mL (07-16-18 2122)   PRN Meds:   Allergies:    Allergies  Allergen Reactions  . Codeine Swelling  . Penicillins Swelling    Has patient had a PCN reaction causing immediate rash, facial/tongue/throat swelling, SOB or lightheadedness with hypotension: Yes Has patient had a PCN reaction causing severe rash involving mucus membranes or skin necrosis: No Has patient had a PCN reaction that required hospitalization: No Has patient had a PCN reaction occurring within the last 10 years: No If all of the above answers are "NO", then may proceed  with Cephalosporin use.  . Tramadol Swelling  . Tylenol [Acetaminophen] Other (See Comments)    "it messes with my liver and my stomach"  pain    Social History:   Social History   Socioeconomic History  . Marital status: Single    Spouse name: Not on file  . Number of children: Not on file  . Years of education: Not on file  . Highest education level: Not on file  Occupational History  . Not on file  Social Needs  . Financial resource strain: Not on file  . Food insecurity:    Worry: Not on file    Inability: Not on file  . Transportation needs:    Medical: Not on file    Non-medical: Not on file  Tobacco Use  . Smoking status: Current Every Day Smoker    Packs/day: 1.00    Types: Cigarettes  . Smokeless tobacco: Never Used  Substance and Sexual Activity  . Alcohol use: No  . Drug use: Yes    Types: Cocaine    Comment: smoked on 06/24/2018  . Sexual activity: Not on file  Lifestyle  . Physical activity:    Days per week: Not on file    Minutes per session: Not on file  . Stress: Not on file  Relationships  . Social connections:    Talks on phone: Not on file    Gets together: Not on file    Attends religious service: Not on file    Active member of club or organization: Not on file    Attends meetings of clubs or organizations: Not on file    Relationship status: Not on file  . Intimate partner violence:    Fear of current or ex partner: Not on file    Emotionally abused: Not on file    Physically abused: Not on file    Forced sexual activity: Not on file  Other Topics Concern  . Not on file  Social History Narrative  . Not on file    Family History:   Family History  Problem Relation Age of Onset  . Heart disease Father   . Hypertension Brother   . Hyperlipidemia Brother   . Rheumatic fever Mother   . Mitral valve prolapse Mother      ROS:  Review of Systems  Unable to perform ROS: Intubated   Physical Exam/Data:   Vitals:   2018/07/04 1949  Jul 04, 2018 2041 07-04-18 2056 07/04/18 2118  BP:  (!) 146/110 106/63 (!) 74/42  Pulse:  (!) 103 91 80  Resp:   (!) 25 (!) 21  Temp:      TempSrc:      SpO2:   99% 93%  Weight: 58.5 kg     Height: 5\' 8"  (1.727 m)       Intake/Output Summary (Last 24 hours) at 2018-07-04 2126 Last data filed at 07/04/2018 2122 Gross per 24 hour  Intake 193.69 ml  Output -  Net 193.69 ml   Filed Weights   04-Jul-2018 1949  Weight: 58.5 kg   Body mass index is 19.61 kg/m.  General: Thin, chronically ill-appearing man lying on stretcher.  He is intubated and unresponsive. HEENT: Endotracheal tube in place Lymph: no adenopathy Neck: Unable to assess due to flat positioning and support devices. Endocrine:  No thryomegaly Vascular: 1+ radial and pedal pulses. Cardiac: Stent heart sounds.  Regular rate and rhythm without murmurs. Lungs: Hoarse breath sounds anteriorly. Abd: Scaphoid abdomen.  Soft. Ext: 1+ ankle edema bilaterally. Musculoskeletal:  No deformities.  Unable to assess strength due to intubation and sedation. Skin: Full extremities. Neuro: Intubated and unresponsive. Psych: Intubated and unresponsive  EKG:  The EKG was personally reviewed and demonstrates: Ventricular pacing and fusion complexes. Telemetry:  Telemetry was personally reviewed and demonstrates: Ventricular pacing and at least 2-3 episodes of sustained ventricular tachycardia.  Relevant CV Studies: Echo 05/2018: Left Ventricle: Normal wall thickness. Cavity is moderately dilated.  Ejection fraction is 15 - 20%. Left ventricular systolic segmental  function is severely decreased. Diastolic function evaluation is  inconclusive. Inconclusive left atrial pressure. Moderate size thrombus present. The thrombus is located in the apex. Right Ventricle: Normal cavity size and ejection fraction. There is mild mitral regurgitation. __________  LHC 03/2018: Coronary Angiography  Coronary angiography showed the patient to have severe  3-vessel coronary artery disease  Comments on coronary anatomy: The LAD and RCA are chronic total occlusions.Medical management advised.   Laboratory Data:  Chemistry Recent Labs  Lab 06/22/18 2304 06/25/18 0456 07-12-2018 1911  NA 126* 128* 126*  K 4.6 5.1 5.1  CL 88* 90* 90*  CO2 29 27 19*  GLUCOSE 520* 512* 961*  BUN 34* 25* 20  CREATININE 0.97 1.11 1.44*  CALCIUM 8.8* 8.9 8.3*  GFRNONAA >60 >60 52*  GFRAA >60 >60 >60  ANIONGAP 9 11 17*    Recent Labs  Lab 2018-07-12 1911  PROT 6.0*  ALBUMIN 3.2*  AST 130*  ALT 59*  ALKPHOS 503*  BILITOT 1.1   Hematology Recent Labs  Lab 06/23/18 0716 06/25/18 0456 07/12/18 1911  WBC 14.3* 11.7* 12.9*  RBC 3.84* 3.89* 3.83*  HGB 13.2 13.6 13.5  HCT 38.0* 39.2* 41.2  MCV 98.9 100.8* 107.6*  MCH 34.5* 34.9* 35.1*  MCHC 34.8 34.6 32.7  RDW 14.1 14.2 15.4*  PLT 207 269 328   Cardiac Enzymes Recent Labs  Lab 06/22/18 2304 06/25/18 0456 06/25/18 0857 06/25/18 1456 06/25/18 2221 07/12/2018 1911  TROPONINI 8.45* 5.48* 5.48* 4.19* 5.37* 4.85*   No results for input(s): TROPIPOC in the last 168 hours.  BNP Recent Labs  Lab 2018/07/12 1911  BNP 2,095.0*    DDimer No results for input(s): DDIMER in the last 168 hours.  Radiology/Studies:  Dg Chest 2 View  Result Date: 06/26/2018 CLINICAL DATA:  Shortness of breath. EXAM: CHEST - 2 VIEW COMPARISON:  Radiograph of June 25, 2018. FINDINGS: Stable cardiomediastinal silhouette. Stable central pulmonary vascular congestion. Left-sided pacemaker is unchanged in position. No pneumothorax is noted. Probable bilateral pulmonary edema is noted with small pleural effusions. Bony thorax is unremarkable. IMPRESSION: Stable central pulmonary vascular congestion and probable bilateral pulmonary edema with small pleural effusions. Electronically Signed   By: Lupita Raider, M.D.   On: 06/26/2018 09:56   Dg Chest Portable 1 View  Result Date: 2018/07/12 CLINICAL DATA:  Chest  heaviness/pressure EXAM: PORTABLE CHEST 1 VIEW COMPARISON:  06/26/2018 FINDINGS: Cardiomegaly with mild interstitial edema. Mild patchy right lower lobe opacity, worrisome for pneumonia. Left basilar opacity, likely atelectasis. Small left pleural effusion. Left subclavian ICD. IMPRESSION: Cardiomegaly with mild interstitial edema and small left pleural effusion. Mild patchy right lower lobe opacity, worrisome for pneumonia. Left basilar opacity, likely atelectasis. Electronically Signed   By: Charline Bills M.D.   On: 2018-07-12 19:27   Dg Chest Port 1 View  Result Date: 06/25/2018 CLINICAL DATA:  57 year old male with chest pain. EXAM: PORTABLE CHEST 1 VIEW COMPARISON:  Chest radiograph dated 06/22/2018 FINDINGS: There is cardiomegaly with vascular congestion. Shallow inspiration with bibasilar atelectasis. Probable trace bilateral pleural effusions. No focal consolidation or pneumothorax. Left pectoral AICD device. No acute osseous pathology. IMPRESSION: Cardiomegaly with findings of CHF grossly unchanged since the most recent prior radiograph. Electronically Signed   By: Elgie Collard M.D.   On: 06/25/2018 06:01    Assessment and Plan:   VT storm Multifactorial including severe systolic heart failure secondary to ischemic cardiomyopathy.  Marked hyperglycemia and electrolyte derangements, and potential recent cocaine use, as the patient has used frequently in the past.  Since intubation, his rhythm has stabilized. 1. Agree with IV amiodarone. 2. If patient has continued ventricular tachycardia/fibrillation, lidocaine infusion may be helpful given ischemic substrate. 3. Ideally, catheterization would be considered but given known multivessel CAD and medication  noncompliance, PCI is contraindicated. 4. I favor avoiding intra-aortic balloon pump placement for hemodynamic support as he is not a candidate for advanced therapies given his repeated noncompliance and ongoing substance abuse. 5. If  blood pressure tolerates, low-dose IV metoprolol can be carefully introduced. 6. Interrogate ICD, when possible.  Coronary artery disease Troponin elevation is nonspecific and may represent supply-demand mismatch.  It has actually trended down from his prior admissions. 1. Initiate heparin infusion given multivessel CAD and history of mural thrombus. 2. Transition carvedilol to IV metoprolol if blood pressure allows. 3. Continue rosuvastatin when enteric access is available.  Acute on chronic systolic heart failure EF severely reduced at 15%.  Patient previously deemed not a candidate for advanced therapies. 1. Given elevated proBNP and chest radiograph findings, some element of acute heart failure is likely present. 2. Blood pressure allows, would advocate for gentle diuresis. 3. Avoid aggressive fluid resuscitation. 4. Restart beta-blocker, lisinopril, and spironolactone when blood pressure allows. 5. If possible, consider sedation with medication other than propofol, given severely reduced LVEF.  History of LV mural thrombus Patient previously prescribed apixaban. 1. Hold apixaban.  Initiate heparin infusion.  Uncontrolled diabetes mellitus Hyperglycemia with electrolyte derangements. 1. Further management per internal medicine. 2. Electrolytes should be closely monitored with target potassium and magnesium greater than 4.0 and 2.0, respectively.  Disposition Patient has Charlene Detter-stage heart failure and is not a candidate for any advanced therapies given his noncompliance and substance abuse.  Recommend palliative care consultation and consideration for comfort care.  For questions or updates, please contact [facility] Please consult [email or web address] for contact info under [facility].  Signed, Yvonne Kendall, MD  24-Jul-2018 9:26 PM

## 2018-06-28 NOTE — ED Notes (Addendum)
defib activated  kinner notified

## 2018-06-28 NOTE — Progress Notes (Signed)
ANTICOAGULATION CONSULT NOTE - Initial Consult  Pharmacy Consult for heparin Indication: chest pain/ACS/afib/flutter/h/o mural thrombus suppose to be on eliquis but h/o homelessness/non-compliance  Allergies  Allergen Reactions  . Codeine Swelling  . Fish Oil Hives  . Penicillins Swelling    Has patient had a PCN reaction causing immediate rash, facial/tongue/throat swelling, SOB or lightheadedness with hypotension: Yes Has patient had a PCN reaction causing severe rash involving mucus membranes or skin necrosis: No Has patient had a PCN reaction that required hospitalization: No Has patient had a PCN reaction occurring within the last 10 years: No If all of the above answers are "NO", then may proceed with Cephalosporin use.  . Simvastatin Hives  . Tramadol Swelling  . Tylenol [Acetaminophen] Other (See Comments)    "it messes with my liver and my stomach"  pain    Patient Measurements: Height: 5\' 8"  (172.7 cm) Weight: 129 lb (58.5 kg) IBW/kg (Calculated) : 68.4 Heparin Dosing Weight: 60 kg  Vital Signs: Temp: 96.8 F (36 C) (09/28 1913) Temp Source: Oral (09/28 1913) BP: 98/64 (09/28 2150) Pulse Rate: 70 (09/28 2150)  Labs: Recent Labs    06/25/18 2221 06/16/2018 1911  HGB  --  13.5  HCT  --  41.2  PLT  --  328  APTT  --  <24*  LABPROT  --  13.8  INR  --  1.07  HEPARINUNFRC <0.10*  --   CREATININE  --  1.44*  TROPONINI 5.37* 4.85*    Estimated Creatinine Clearance: 46.3 mL/min (A) (by C-G formula based on SCr of 1.44 mg/dL (H)).   Medical History: Past Medical History:  Diagnosis Date  . Acute MI (HCC)   . CHF (congestive heart failure) (HCC)   . Diabetes mellitus without complication (HCC)   . H/O blood clots   . Hyperlipemia   . Hypertension   . Left kidney mass     Medications:  Scheduled:  . docusate sodium  100 mg Oral BID  . [START ON July 22, 2018] insulin aspart  0-15 Units Subcutaneous TID WC  . insulin aspart  0-5 Units Subcutaneous QHS  .  insulin glargine  35 Units Subcutaneous Daily  . ipratropium-albuterol  3 mL Nebulization Q6H  . [START ON 07/22/2018] metoprolol tartrate  2.5 mg Intravenous Q6H    Assessment: Patient admitted for CP and abdominal pain. 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 Initial trops 5.37 >> 4.85 EKG showing afib/flutter w/ V-paced complexes. Patient is being started on heparin drip for afib/flutter/h/o mural thrombus/and possible NSTEMI  Goal of Therapy:  Heparin level 0.3-0.7 units/ml Monitor platelets by anticoagulation protocol: Yes   Plan:  Will bolus heparin w/ 3600 units IV x 1 Will start heparin drip @ 900 units/hr  Baseline labs drawn Will drawanti-Xa w/ am labs. Will monitor daily CBC and adjust per anti-Xa levels.  [name], PharmD, BCPS Clinical Pharmacist 06/20/2018

## 2018-06-28 NOTE — ED Notes (Signed)
Shock admined att. EDP bedside

## 2018-06-29 ENCOUNTER — Inpatient Hospital Stay: Payer: Medicaid - Out of State

## 2018-06-29 DIAGNOSIS — I25119 Atherosclerotic heart disease of native coronary artery with unspecified angina pectoris: Secondary | ICD-10-CM

## 2018-06-29 DIAGNOSIS — I214 Non-ST elevation (NSTEMI) myocardial infarction: Secondary | ICD-10-CM

## 2018-06-29 DIAGNOSIS — I959 Hypotension, unspecified: Secondary | ICD-10-CM

## 2018-06-29 DIAGNOSIS — J9601 Acute respiratory failure with hypoxia: Secondary | ICD-10-CM

## 2018-06-29 DIAGNOSIS — I255 Ischemic cardiomyopathy: Secondary | ICD-10-CM

## 2018-06-29 DIAGNOSIS — R739 Hyperglycemia, unspecified: Secondary | ICD-10-CM

## 2018-06-29 DIAGNOSIS — J810 Acute pulmonary edema: Secondary | ICD-10-CM

## 2018-06-29 DIAGNOSIS — I5022 Chronic systolic (congestive) heart failure: Secondary | ICD-10-CM

## 2018-06-29 LAB — COMPREHENSIVE METABOLIC PANEL
ALT: 215 U/L — ABNORMAL HIGH (ref 0–44)
AST: 478 U/L — ABNORMAL HIGH (ref 15–41)
Albumin: 3.1 g/dL — ABNORMAL LOW (ref 3.5–5.0)
Alkaline Phosphatase: 682 U/L — ABNORMAL HIGH (ref 38–126)
Anion gap: 9 (ref 5–15)
BUN: 25 mg/dL — AB (ref 6–20)
CHLORIDE: 95 mmol/L — AB (ref 98–111)
CO2: 26 mmol/L (ref 22–32)
Calcium: 8.4 mg/dL — ABNORMAL LOW (ref 8.9–10.3)
Creatinine, Ser: 1.41 mg/dL — ABNORMAL HIGH (ref 0.61–1.24)
GFR calc Af Amer: >60 mL/min (ref >60)
GFR, EST NON AFRICAN AMERICAN: 53 mL/min — AB (ref >60)
Glucose, Bld: 639 mg/dL (ref 70–99)
POTASSIUM: 4 mmol/L (ref 3.5–5.1)
Sodium: 130 mmol/L — ABNORMAL LOW (ref 135–145)
Total Bilirubin: 0.8 mg/dL (ref 0.3–1.2)
Total Protein: 5.9 g/dL — ABNORMAL LOW (ref 6.5–8.1)

## 2018-06-29 LAB — GLUCOSE, CAPILLARY
GLUCOSE-CAPILLARY: 413 mg/dL — AB (ref 70–99)
GLUCOSE-CAPILLARY: 306 mg/dL — AB (ref 70–99)
GLUCOSE-CAPILLARY: 63 mg/dL — AB (ref 70–99)
GLUCOSE-CAPILLARY: 97 mg/dL (ref 70–99)
Glucose-Capillary: 452 mg/dL — ABNORMAL HIGH (ref 70–99)
Glucose-Capillary: 289 mg/dL — ABNORMAL HIGH (ref 70–99)
Glucose-Capillary: 242 mg/dL — ABNORMAL HIGH (ref 70–99)
Glucose-Capillary: 51 mg/dL — ABNORMAL LOW (ref 70–99)
Glucose-Capillary: 39 mg/dL — CL (ref 70–99)
Glucose-Capillary: 69 mg/dL — ABNORMAL LOW (ref 70–99)
Glucose-Capillary: 80 mg/dL (ref 70–99)
Glucose-Capillary: 102 mg/dL — ABNORMAL HIGH (ref 70–99)

## 2018-06-29 LAB — CBC
HCT: 36.2 % — ABNORMAL LOW (ref 40.0–52.0)
HEMATOCRIT: 37.7 % — AB (ref 40.0–52.0)
Hemoglobin: 12.3 g/dL — ABNORMAL LOW (ref 13.0–18.0)
Hemoglobin: 13.6 g/dL (ref 13.0–18.0)
MCH: 35.4 pg — ABNORMAL HIGH (ref 26.0–34.0)
MCH: 34.5 pg — AB (ref 26.0–34.0)
MCHC: 34 g/dL (ref 32.0–36.0)
MCHC: 36.1 g/dL — ABNORMAL HIGH (ref 32.0–36.0)
MCV: 101.4 fL — AB (ref 80.0–100.0)
MCV: 98.1 fL (ref 80.0–100.0)
PLATELETS: 286 10*3/uL (ref 150–440)
Platelets: 303 10*3/uL (ref 150–440)
RBC: 3.57 MIL/uL — AB (ref 4.40–5.90)
RBC: 3.84 MIL/uL — AB (ref 4.40–5.90)
RDW: 13.5 % (ref 11.5–14.5)
RDW: 14 % (ref 11.5–14.5)
WBC: 19.9 10*3/uL — ABNORMAL HIGH (ref 3.8–10.6)
WBC: 21.1 10*3/uL — AB (ref 3.8–10.6)

## 2018-06-29 LAB — BLOOD GAS, ARTERIAL
ACID-BASE DEFICIT: 1.6 mmol/L (ref 0.0–2.0)
Bicarbonate: 25.2 mmol/L (ref 20.0–28.0)
FIO2: 0.4
MECHVT: 500 mL
O2 SAT: 96.2 %
PATIENT TEMPERATURE: 37
PCO2 ART: 50 mmHg — AB (ref 32.0–48.0)
PEEP: 5 cmH2O
PH ART: 7.31 — AB (ref 7.350–7.450)
PO2 ART: 91 mmHg (ref 83.0–108.0)
RATE: 16 resp/min

## 2018-06-29 LAB — BASIC METABOLIC PANEL
Anion gap: 9 (ref 5–15)
BUN: 23 mg/dL — AB (ref 6–20)
CO2: 28 mmol/L (ref 22–32)
Calcium: 8.6 mg/dL — ABNORMAL LOW (ref 8.9–10.3)
Chloride: 97 mmol/L — ABNORMAL LOW (ref 98–111)
Creatinine, Ser: 1.07 mg/dL (ref 0.61–1.24)
GFR calc Af Amer: >60 mL/min (ref >60)
GLUCOSE: 339 mg/dL — AB (ref 70–99)
POTASSIUM: 3.9 mmol/L (ref 3.5–5.1)
Sodium: 134 mmol/L — ABNORMAL LOW (ref 135–145)

## 2018-06-29 LAB — PHOSPHORUS: PHOSPHORUS: 3.1 mg/dL (ref 2.5–4.6)

## 2018-06-29 LAB — APTT: aPTT: 60 seconds — ABNORMAL HIGH (ref 24–36)

## 2018-06-29 LAB — HEPARIN LEVEL (UNFRACTIONATED)
HEPARIN UNFRACTIONATED: 0.33 [IU]/mL (ref 0.30–0.70)
HEPARIN UNFRACTIONATED: 0.1 [IU]/mL — AB (ref 0.30–0.70)

## 2018-06-29 LAB — MRSA PCR SCREENING: MRSA by PCR: NEGATIVE

## 2018-06-29 LAB — MAGNESIUM: MAGNESIUM: 2.6 mg/dL — AB (ref 1.7–2.4)

## 2018-06-29 MED ORDER — DEXTROSE 50 % IV SOLN
12.0000 mL | Freq: Once | INTRAVENOUS | Status: AC
  Administered 2018-06-29: 12 mL via INTRAVENOUS

## 2018-06-29 MED ORDER — DEXTROSE 50 % IV SOLN
14.0000 mL | Freq: Once | INTRAVENOUS | Status: AC
  Administered 2018-06-29: 14 mL via INTRAVENOUS

## 2018-06-29 MED ORDER — DEXTROSE 50 % IV SOLN
INTRAVENOUS | Status: AC
  Administered 2018-06-29: 14 mL via INTRAVENOUS
  Filled 2018-06-29: qty 50

## 2018-06-29 MED ORDER — INSULIN ASPART 100 UNIT/ML ~~LOC~~ SOLN: 0.0000 [IU] | SUBCUTANEOUS | Status: DC

## 2018-06-29 MED ORDER — CHLORHEXIDINE GLUCONATE 0.12% ORAL RINSE (MEDLINE KIT)
15.0000 mL | Freq: Two times a day (BID) | OROMUCOSAL | Status: DC
  Administered 2018-06-29 (×2): 15 mL via OROMUCOSAL

## 2018-06-29 MED ORDER — HEPARIN BOLUS VIA INFUSION
1800.0000 [IU] | Freq: Once | INTRAVENOUS | Status: AC
  Administered 2018-06-29: 1800 [IU] via INTRAVENOUS
  Filled 2018-06-29: qty 1800

## 2018-06-29 MED ORDER — VANCOMYCIN HCL IN DEXTROSE 1-5 GM/200ML-% IV SOLN
1000.0000 mg | Freq: Once | INTRAVENOUS | Status: AC
  Administered 2018-06-29: 1000 mg via INTRAVENOUS
  Filled 2018-06-29: qty 200

## 2018-06-29 MED ORDER — ORAL CARE MOUTH RINSE
15.0000 mL | OROMUCOSAL | Status: DC
  Administered 2018-06-29 (×5): 15 mL via OROMUCOSAL

## 2018-06-29 MED ORDER — FAMOTIDINE IN NACL 20-0.9 MG/50ML-% IV SOLN
20.0000 mg | Freq: Two times a day (BID) | INTRAVENOUS | Status: DC
  Administered 2018-06-29 (×2): 20 mg via INTRAVENOUS
  Filled 2018-06-29 (×2): qty 50

## 2018-06-29 MED ORDER — DEXTROSE 50 % IV SOLN
INTRAVENOUS | Status: AC
  Administered 2018-06-29: 08:00:00
  Filled 2018-06-29: qty 50

## 2018-06-29 MED ORDER — ORAL CARE MOUTH RINSE: 15.0000 mL | Freq: Two times a day (BID) | OROMUCOSAL | Status: DC

## 2018-06-29 MED ORDER — VANCOMYCIN HCL 500 MG IV SOLR
500.0000 mg | Freq: Two times a day (BID) | INTRAVENOUS | Status: DC
  Filled 2018-06-29 (×2): qty 500

## 2018-06-29 MED ORDER — IPRATROPIUM-ALBUTEROL 0.5-2.5 (3) MG/3ML IN SOLN: 3.0000 mL | RESPIRATORY_TRACT | Status: DC | PRN

## 2018-06-29 MED ORDER — LORAZEPAM 2 MG/ML IJ SOLN
0.5000 mg | INTRAMUSCULAR | Status: DC | PRN
  Administered 2018-06-29: 1 mg via INTRAVENOUS
  Filled 2018-06-29: qty 1

## 2018-06-29 MED ORDER — SODIUM CHLORIDE 0.9 % IV SOLN
2.0000 mg/h | INTRAVENOUS | Status: DC
  Administered 2018-06-29: 0.5 mg/h via INTRAVENOUS
  Filled 2018-06-29: qty 10

## 2018-06-29 MED ORDER — SODIUM CHLORIDE 0.9 % IV SOLN
2.0000 g | Freq: Two times a day (BID) | INTRAVENOUS | Status: DC
  Administered 2018-06-29: 2 g via INTRAVENOUS
  Filled 2018-06-29 (×3): qty 2

## 2018-06-29 MED FILL — Medication: Qty: 1 | Status: AC

## 2018-06-30 LAB — LACTIC ACID, PLASMA: LACTIC ACID, VENOUS: 6.8 mmol/L — AB (ref 0.5–1.9)

## 2018-06-30 LAB — GLUCOSE, CAPILLARY

## 2018-07-01 LAB — BLOOD GAS, VENOUS
Acid-base deficit: 6.6 mmol/L — ABNORMAL HIGH (ref 0.0–2.0)
BICARBONATE: 21 mmol/L (ref 20.0–28.0)
Patient temperature: 37
pCO2, Ven: 49 mmHg (ref 44.0–60.0)
pH, Ven: 7.24 — ABNORMAL LOW (ref 7.250–7.430)

## 2018-07-01 NOTE — Progress Notes (Addendum)
Advanced Care Plan.  Purpose of Encounter: Palliative care or hospice care. Parties in Attendance: the patient, his sister, me. Patient's Decisional Capacity: No Medical Story: Mario Gallagher  is a 57 y.o. 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  He is admitted for V. Tach, cardiac arrest,  acute respiratory failure, acute on chronic systolic CHF EF 15%, DKA and pneumonia.  He is intubated on ventilation.  He is on dopamine drip due to cardiogenic hypotension.  Discussed with his sister about his critical condition, very poor prognosis, palliative care, hospice care.  She said the patient has no POA.  She voiced understanding but she will discuss with her brother and make decision. Goals of Care Determinations:  Palliative care and comfort care. Plan:  Code Status: Full code. Time spent discussing advance care planning: 20 minutes.

## 2018-07-01 NOTE — Progress Notes (Signed)
Transported pt to ICU on vent without incident

## 2018-07-01 NOTE — Progress Notes (Signed)
Pt. Self extubated. Placed on bipap at this time.

## 2018-07-01 NOTE — Progress Notes (Addendum)
Progress Note  Patient Name: Mario Gallagher Date of Encounter: 2018/07/19  Primary Cardiologist: Allie Dimmer  Subjective   Intubated.  Placed on dopamine overnight due to hypotension.  Inpatient Medications    Scheduled Meds: . chlorhexidine gluconate (MEDLINE KIT)  15 mL Mouth Rinse BID  . dextrose      . docusate sodium  100 mg Oral BID  . insulin aspart  0-15 Units Subcutaneous TID WC  . insulin aspart  0-5 Units Subcutaneous QHS  . insulin glargine  35 Units Subcutaneous Daily  . ipratropium-albuterol  3 mL Nebulization Q6H  . mouth rinse  15 mL Mouth Rinse 10 times per day  . metoprolol tartrate  2.5 mg Intravenous Q6H  . [COMPLETED] midazolam       Continuous Infusions: . sodium chloride 75 mL/hr at 07-19-2018 0619  . amiodarone 30 mg/hr (July 19, 2018 0600)  . ceFEPime (MAXIPIME) IV Stopped (2018/07/19 0224)  . DOPamine 5 mcg/kg/min (2018-07-19 0741)  . famotidine (PEPCID) IV Stopped (07-19-18 0048)  . fentaNYL infusion INTRAVENOUS 200 mcg/hr (Jul 19, 2018 0741)  . heparin 900 Units/hr (07/19/18 0600)  . insulin (NOVOLIN-R) infusion Stopped (07/19/2018 2841)  . midazolam (VERSED) infusion 2 mg/hr (07-19-2018 0741)  . norepinephrine (LEVOPHED) Adult infusion Stopped (07-19-18 0003)  . vancomycin     PRN Meds: acetaminophen **OR** acetaminophen, bisacodyl, HYDROcodone-acetaminophen, ondansetron **OR** ondansetron (ZOFRAN) IV   Vital Signs    Vitals:   19-Jul-2018 0728 2018-07-19 0730 07/19/18 0738 July 19, 2018 0800  BP:  107/63    Pulse:  86  65  Resp:  16  16  Temp:      TempSrc:      SpO2: 99% 99% 97%   Weight:      Height:        Intake/Output Summary (Last 24 hours) at 07/19/18 0849 Last data filed at 19-Jul-2018 3244 Gross per 24 hour  Intake 1736.85 ml  Output 880 ml  Net 856.85 ml   Filed Weights   06/05/2018 1949 06/23/2018 2330 07/19/2018 0356  Weight: 58.5 kg 62.7 kg 63.8 kg    Telemetry    NSR with ventricular pacing.  Occasional PVC's.  No further VT since arriving in ICU  - Personally Reviewed  ECG    No new tracing  Physical Exam   GEN: Unresponsive. Neck: Unable to assess JVP due to supine positioning and support devices. Cardiac: Distant heart sounds.  RRR without murmurs. Respiratory: Clear anteriorly GI: Soft, nontender, non-distended  MS: No edema; No deformity. Neuro:  Nonfocal  Psych: Normal affect   Labs    Chemistry Recent Labs  Lab 06/15/2018 1911 July 19, 2018 0013 07/19/18 0313  NA 126* 130* 134*  K 5.1 4.0 3.9  CL 90* 95* 97*  CO2 19* 26 28  GLUCOSE 961* 639* 339*  BUN 20 25* 23*  CREATININE 1.44* 1.41* 1.07  CALCIUM 8.3* 8.4* 8.6*  PROT 6.0* 5.9*  --   ALBUMIN 3.2* 3.1*  --   AST 130* 478*  --   ALT 59* 215*  --   ALKPHOS 503* 682*  --   BILITOT 1.1 0.8  --   GFRNONAA 52* 53* >60  GFRAA >60 >60 >60  ANIONGAP 17* 9 9     Hematology Recent Labs  Lab 06/10/2018 1911 07/19/2018 0013 07-19-2018 0314  WBC 12.9* 19.9* 21.1*  RBC 3.83* 3.57* 3.84*  HGB 13.5 12.3* 13.6  HCT 41.2 36.2* 37.7*  MCV 107.6* 101.4* 98.1  MCH 35.1* 34.5* 35.4*  MCHC 32.7 34.0 36.1*  RDW 15.4*  14.0 13.5  PLT 328 286 303    Cardiac Enzymes Recent Labs  Lab 06/25/18 0857 06/25/18 1456 06/25/18 2221 06/25/2018 1911  TROPONINI 5.48* 4.19* 5.37* 4.85*   No results for input(s): TROPIPOC in the last 168 hours.   BNP Recent Labs  Lab 06/23/2018 1911  BNP 2,095.0*     DDimer No results for input(s): DDIMER in the last 168 hours.   Radiology    Dg Abd 1 View  Result Date: 07/23/2018 CLINICAL DATA:  57 year old male with NG tube placement. EXAM: ABDOMEN - 1 VIEW COMPARISON:  Radiograph dated 06/20/2018 FINDINGS: An enteric tube is partially visualized with tip and side-port in the left upper abdomen likely in the proximal stomach. Mildly dilated air-filled loops of small bowel in the lower abdomen and pelvis measure up to 3 cm. IMPRESSION: Enteric tube with tip likely in the proximal stomach. Electronically Signed   By: Anner Crete M.D.    On: 07-23-18 01:42   Dg Abdomen 1 View  Result Date: 06/01/2018 CLINICAL DATA:  OG tube placement EXAM: ABDOMEN - 1 VIEW COMPARISON:  06/13/2018 FINDINGS: Partially visualized cardiac pacing leads. Esophageal tube tip terminates at the distal esophagus. Small pleural effusions, cardiomegaly, consolidation at the left base. Tip of the right central venous catheter overlies the cavoatrial region IMPRESSION: Tip of the esophageal tube overlies the distal esophagus, further advancement suggested for more optimal positioning. Electronically Signed   By: Donavan Foil M.D.   On: 06/04/2018 21:50   Portable Chest Xray  Result Date: July 23, 2018 CLINICAL DATA:  Respiratory failure EXAM: PORTABLE CHEST 1 VIEW COMPARISON:  06/21/2018 FINDINGS: Endotracheal tube NG tube and central venous line unchanged. Stable cardiac silhouette. There is bilateral pleural effusions which are slightly increased. No pulmonary edema. No pneumothorax. Central venous congestion is present. IMPRESSION: 1. Stable support apparatus. 2. Increase in bilateral pleural effusions. 3. Persistent central venous congestion. Electronically Signed   By: Suzy Bouchard M.D.   On: Jul 23, 2018 08:33   Dg Chest Portable 1 View  Result Date: 06/07/2018 CLINICAL DATA:  Central line placement and intubated patient EXAM: PORTABLE CHEST 1 VIEW COMPARISON:  06/27/2018, 06/26/2018, 06/25/2018 FINDINGS: Left-sided pacing device as before. Endotracheal tube tip is about 3.9 cm superior to the carina. Esophageal tube tip projects over the distal esophagus. Right IJ central venous catheter tip at the cavoatrial junction. No pneumothorax. Cardiomegaly with vascular congestion and pulmonary edema. Small pleural effusions. Consolidation at the left lung base. IMPRESSION: 1. Endotracheal tube tip about 3.9 cm superior to the carina. 2. Right IJ central venous catheter tip overlies the cavoatrial junction. No pneumothorax 3. Cardiomegaly with small pleural effusion,  vascular congestion and pulmonary edema. Worsening consolidation at the left lung base. Electronically Signed   By: Donavan Foil M.D.   On: 06/10/2018 21:49   Dg Chest Portable 1 View  Result Date: 06/18/2018 CLINICAL DATA:  Chest heaviness/pressure EXAM: PORTABLE CHEST 1 VIEW COMPARISON:  06/26/2018 FINDINGS: Cardiomegaly with mild interstitial edema. Mild patchy right lower lobe opacity, worrisome for pneumonia. Left basilar opacity, likely atelectasis. Small left pleural effusion. Left subclavian ICD. IMPRESSION: Cardiomegaly with mild interstitial edema and small left pleural effusion. Mild patchy right lower lobe opacity, worrisome for pneumonia. Left basilar opacity, likely atelectasis. Electronically Signed   By: Julian Hy M.D.   On: 06/27/2018 19:27    Cardiac Studies   Echo 05/2018: Left Ventricle: Normal wall thickness. Cavity is moderately dilated.  Ejection fraction is 15 - 20%. Left ventricular systolic segmental  function is severely decreased. Diastolic function evaluation is  inconclusive. Inconclusive left atrial pressure. Moderate size thrombus present. The thrombus is located in the apex. Right Ventricle: Normal cavity size and ejection fraction. There is mild mitral regurgitation. __________  LHC 03/2018: Coronary Angiography Coronary angiography showed the patient to have severe 3-vessel coronary artery disease  Comments on coronary anatomy: The LAD and RCA are chronic total occlusions.LCx has 80% stenosis. Medical management advised.   Patient Profile     57 y.o. 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  Most likely driven by combination of severe LV dysfunction, ischemic cardiomyopathy with severe 3-vessel CAD, hyperglycemia with electrolyte abnormalities, cocaine use (UDS positive every admission since 01/2018), and likely non-compliance with cardiac meds.  Interrogation performed in the ED yesterday personally reviewed.  Patient had 31 episodes of NSVT and sustained VT, receiving numerous therapies (ATP and shocks).  Continue amiodarone infusion.  Hold metoprolol in the setting of hypotension requiring dopamine.  Wean off dopamine.  I would prefer ginger use of norepinephrine if vasopressor support is needed.  Maintain potassium and magnesium > 4.0 and 2.0, respectively.  No plans for ischemia evaluation or mechanical support.  Patient intubated due to VT storm; work towards extubation at discretion of critical care medicine.  EP consultation if recurrent VT.  Coronary artery disease Severe 3-vessel disease, unrevascularized.  Previously turned down for CABG and PCI due to comorbidities and non-compliance.  Continue heparin infusion.  Restart low-dose beta-blocker as BP allows.  Consider adding back rosuvastatin when able to tolerate oral intake.  No plans for catheterization/revascularization (given repeated non-compliance, PCI is contraindicated).  Chronic systolic heart failure due to ischemic cardiomyopathy Grossly euvolemic on exam but CXR shows pulmonary edema.  Consider diuresis as blood pressure allows when weaned of vasopressors.  May be useful to use CVP monitoring to guide therapy.  Restart low-dose beta-blocker when able.  Patient not a candidate for advanced options (VAD/transplant given long history and ongoing non-compliance and ongoing drug use).  Hypotension Likely multifactorial.  Minimize sedation; avoid propofol, if possible.  Wean dopamine; if pressor is needed, would prefer norepinephrine.  Disposition Continue  ICU care.  Discussion with patient (if/when extubated) and family regarding goals of care will be important.  I recommend consideration of comfort care, as patient has no options at this time to improve his Patte Winkel-stage heart failure and coronary artery disease.  Further aggressive measures are nearing futility.  For questions or updates, please contact [place] Please consult [email or web address] for contact info under [facility] Cardiology.     Signed, Nelva Bush, MD  07/02/18, 8:49 AM

## 2018-07-01 NOTE — Significant Event (Signed)
Patient self extubated approximately 30 minutes ago.  I went by to review him.  He had been started on BiPAP 12/6.  He was well supported on this.  He remains somewhat lethargic but able to follow commands.  We will continue BiPAP as needed.  All sedation orders discontinued except for very low-dose lorazepam as needed for anxiety/agitation.  Billy Fischer, MD PCCM service Mobile [phone] Pager [phone] 06/24/2018 1:54 PM

## 2018-07-01 NOTE — Progress Notes (Signed)
Pharmacy Antibiotic Note  Mario Gallagher is a 57 y.o. male admitted on 07/08/18 with pneumonia.  Pharmacy has been consulted for vanc/cefepime dosing. Patient received vanc 1g and cefepime 2g IV x 1  Plan: Will continue vanc 500 mg IV q12h w/ 8 hour stack  Patient's TBW < IBW, will dose a bit conservatively where vanc 500 mg IV q12h provides a Css of 15 mcg/mL Will draw vanc trough 09/30 @ 2100 prior to 4th dose. Will continue cefepime 2g IV q12h  Ke 0.0464 T1/2 15 ~ decreasing dose to 500 mg and administering q12h for ease of administration Goal trough 15 - 20 mcg/mL  500 mg IV q12h = Css 15 mcg/mL 750 mg IV q12h = Css 21.4 mcg/mL  Height: 5\' 8"  (172.7 cm) Weight: 138 lb 3.7 oz (62.7 kg) IBW/kg (Calculated) : 68.4  Temp (24hrs), Avg:96.9 F (36.1 C), Min:96.8 F (36 C), Max:97 F (36.1 C)  Recent Labs  Lab 06/22/18 0721 06/22/18 2304 06/23/18 0716 06/25/18 0456 2018/07/08 1911 07-08-2018 1912 2018/07/08 2201 06/10/2018 0013  WBC 11.4* 11.5* 14.3* 11.7* 12.9*  --   --  19.9*  CREATININE 1.12 0.97  --  1.11 1.44*  --   --  1.41*  LATICACIDVEN  --   --   --   --   --  8.1* 6.8*  --     Estimated Creatinine Clearance: 50.6 mL/min (A) (by C-G formula based on SCr of 1.41 mg/dL (H)).    Allergies  Allergen Reactions  . Codeine Swelling  . Fish Oil Hives  . Penicillins Swelling    Has patient had a PCN reaction causing immediate rash, facial/tongue/throat swelling, SOB or lightheadedness with hypotension: Yes Has patient had a PCN reaction causing severe rash involving mucus membranes or skin necrosis: No Has patient had a PCN reaction that required hospitalization: No Has patient had a PCN reaction occurring within the last 10 years: No If all of the above answers are "NO", then may proceed with Cephalosporin use.  . Simvastatin Hives  . Tramadol Swelling  . Tylenol [Acetaminophen] Other (See Comments)    "it messes with my liver and my stomach"  pain    Thank you for  allowing pharmacy to be a part of this patient's care.  [name], PharmD, BCPS Clinical Pharmacist 06/20/2018

## 2018-07-01 NOTE — ED Provider Notes (Signed)
[facility]  Department of Emergency Medicine   Code Blue CONSULT NOTE  Chief Complaint: Cardiac arrest/unresponsive   Level V Caveat: Unresponsive  History of present illness: I was contacted by the hospital for a CODE BLUE cardiac arrest upstairs and presented to the patient's bedside.  Per nursing staff the patient had been admitted because of multiple firings of his implanted defibrillator.  Apparently he had extubated himself earlier today and had been maintained on BiPAP.  Apparently when nursing staff is in the room they noticed that he started having difficulty breathing.  Patient had become agonal. Patient was found to lack pulses. Continued to have electrical activity (patient with pacer).   ROS: Unable to obtain, Level V caveat  Scheduled Meds: . docusate sodium  100 mg Oral BID  . mouth rinse  15 mL Mouth Rinse BID  . [COMPLETED] midazolam       Continuous Infusions: . amiodarone 30 mg/hr (06/13/2018 1200)  . DOPamine Stopped (06/14/2018 1503)  . famotidine (PEPCID) IV Stopped (06/01/2018 1040)  . heparin 1,150 Units/hr (06/20/2018 1200)   PRN Meds:.ipratropium-albuterol, LORazepam, [DISCONTINUED] ondansetron **OR** ondansetron (ZOFRAN) IV Past Medical History:  Diagnosis Date  . Acute MI (HCC)   . CHF (congestive heart failure) (HCC)   . Diabetes mellitus without complication (HCC)   . H/O blood clots   . Hyperlipemia   . Hypertension   . Left kidney mass    Past Surgical History:  Procedure Laterality Date  . CARDIAC DEFIBRILLATOR PLACEMENT    . HERNIA REPAIR     Social History   Socioeconomic History  . Marital status: Single    Spouse name: Not on file  . Number of children: Not on file  . Years of education: Not on file  . Highest education level: Not on file  Occupational History  . Not on file  Social Needs  . Financial resource strain: Not on file  . Food insecurity:    Worry: Not on file    Inability: Not on file  . Transportation  needs:    Medical: Not on file    Non-medical: Not on file  Tobacco Use  . Smoking status: Current Every Day Smoker    Packs/day: 1.00    Types: Cigarettes  . Smokeless tobacco: Never Used  Substance and Sexual Activity  . Alcohol use: No  . Drug use: Yes    Types: Cocaine    Comment: smoked on 06/24/2018  . Sexual activity: Not Currently  Lifestyle  . Physical activity:    Days per week: Not on file    Minutes per session: Not on file  . Stress: Not on file  Relationships  . Social connections:    Talks on phone: Not on file    Gets together: Not on file    Attends religious service: Not on file    Active member of club or organization: Not on file    Attends meetings of clubs or organizations: Not on file    Relationship status: Not on file  . Intimate partner violence:    Fear of current or ex partner: Not on file    Emotionally abused: Not on file    Physically abused: Not on file    Forced sexual activity: Not on file  Other Topics Concern  . Not on file  Social History Narrative  . Not on file   Allergies  Allergen Reactions  . Codeine Swelling  . Fish Oil Hives  . Penicillins  Swelling    Has patient had a PCN reaction causing immediate rash, facial/tongue/throat swelling, SOB or lightheadedness with hypotension: Yes Has patient had a PCN reaction causing severe rash involving mucus membranes or skin necrosis: No Has patient had a PCN reaction that required hospitalization: No Has patient had a PCN reaction occurring within the last 10 years: No If all of the above answers are "NO", then may proceed with Cephalosporin use.  . Simvastatin Hives  . Tramadol Swelling  . Tylenol [Acetaminophen] Other (See Comments)    "it messes with my liver and my stomach"  pain    Last set of Vital Signs (not current) Vitals:   July 01, 2018 1300 01-Jul-2018 1351  BP: 94/62   Pulse: 91 (!) 102  Resp: 14 14  Temp:    SpO2: 97% 97%      Physical Exam  Gen:  unresponsive Cardiovascular: pulseless  Resp: apneic.  Abd: nondistended  Neuro: GCS 3, unresponsive to pain  HEENT: No blood in posterior pharynx, gag reflex absent  Neck: No crepitus  Musculoskeletal: No deformity  Skin: mottled  Procedures  INTUBATION Performed by: Phineas Semen Required items: required blood products, implants, devices, and special equipment available Patient identity confirmed: provided demographic data and hospital-assigned identification number Time out: Immediately prior to procedure a "time out" was called to verify the correct patient, procedure, equipment, support staff and site/side marked as required. Indications: cardiopulmonary arrest Intubation method: glidescope Preoxygenation: BVM Sedatives: None Paralytic: None Tube Size: 8-0 cuffed Post-procedure assessment: chest rise and ETCO2 monitor Breath sounds: equal and absent over the epigastrium Tube secured by Respiratory Therapy Patient tolerated the procedure well with no immediate complications.  CRITICAL CARE Performed by: Phineas Semen Total critical care time: 20 Critical care time was exclusive of separately billable procedures and treating other patients. Critical care was necessary to treat or prevent imminent or life-threatening deterioration. Critical care was time spent personally by me on the following activities: development of treatment plan with patient and/or surrogate as well as nursing, discussions with consultants, evaluation of patient's response to treatment, examination of patient, obtaining history from patient or surrogate, ordering and performing treatments and interventions, ordering and review of laboratory studies, ordering and review of radiographic studies, pulse oximetry and re-evaluation of patient's condition.  Cardiopulmonary Resuscitation (CPR) Procedure Note  Directed/Performed by: Phineas Semen I personally directed ancillary staff and/or performed CPR in  an effort to regain return of spontaneous circulation and to maintain cardiac, neuro and systemic perfusion.    Medical Decision making  Called to patient's bedside because of a CODE BLUE.  On initial exam patient was pulseless, actively being back.  Skin was mottled.  Patient had poor oxygenation on monitor continue to have pacemaker activity.  After multiple rounds of CPR and medications and intubation there is no improvement in patient's conditions.  Pulses were not regained.  Hypoxia did not improve after intubation.  Patient was pronounced dead.     Phineas Semen, MD 01-Jul-2018 [phone]

## 2018-07-01 NOTE — Discharge Summary (Addendum)
   [facility] - [place] [facility]   PATIENT NAME: Mario Gallagher    MR#:  [id number]  DATE OF BIRTH:  12-21-1960  DATE OF ADMISSION:  2018-07-18   ADMITTING PHYSICIAN: Cammy Copa, MD  DATE OF DEATH: 07/19/18 16:08. PRIMARY CARE PHYSICIAN: System, Pcp Not In   ADMISSION DIAGNOSIS:  Cardiac arrest (HCC) [I46.9] Ventricular tachycardia (HCC) [I47.2] Elevated troponin [R74.8] Diabetic ketoacidosis without coma associated with other specified diabetes mellitus (HCC) [E13.10] Chest pain, unspecified type [R07.9] Community acquired pneumonia of left lower lobe of lung (HCC) [J18.1] DISCHARGE DIAGNOSIS:  Active Problems:   Acute respiratory failure (HCC)  SECONDARY DIAGNOSIS:   Past Medical History:  Diagnosis Date  . Acute MI (HCC)   . CHF (congestive heart failure) (HCC)   . Diabetes mellitus without complication (HCC)   . H/O blood clots   . Hyperlipemia   . Hypertension   . Left kidney mass    HOSPITAL COURSE:  JohnnyBrooksis a57 y.o.malewith a known history ofcoronary artery disease, status post MI, ICM with an ejection fraction of 15-20%, status post ICD placement, VT/VFon amiodarone, mural thrombus on Eliquis, alcohol tobacco and cocaine abuse and other comorbidities.  He was admitted for V. Tach, cardiac arrest,  acute respiratory failure, acute on chronic systolic CHF EF 15%, DKA and pneumonia.  He was intubated on ventilation.  He was on dopamine drip due to cardiogenic hypotension. The patient self extubated and was put on BiPAP this afternoon. CODE BLUE was code at about 15:50 due to cardiac arrest, CPR was started and the patient was reintubated by Dr. Derrill Kay.  Patient was treated with epinephrine and shock. I discussed with the patient's sister and brother about the patient's critical condition and very poor prognosis.  The brother said he could not make a decision to withdraw CPR.  The sister also could not decide.  CODE BLUE lasted about 20  minutes.  The patient died at 16:08.   Shaune Pollack M.D on 07-19-2018 at 4:32 PM  Between 7am to 6pm - Pager - [phone]  After 6pm go to [email or web address] - Social research officer, government  [facility] [place] [facility]  Office  [phone]  CC: Primary care physician; System, Pcp Not In   Note: This dictation was prepared with Dragon dictation along with smaller phrase technology. Any transcriptional errors that result from this process are unintentional.

## 2018-07-01 NOTE — Progress Notes (Signed)
ANTICOAGULATION CONSULT NOTE - Initial Consult  Pharmacy Consult for heparin Indication: chest pain/ACS/afib/flutter/h/o mural thrombus suppose to be on eliquis but h/o homelessness/non-compliance  Allergies  Allergen Reactions  . Codeine Swelling  . Fish Oil Hives  . Penicillins Swelling    Has patient had a PCN reaction causing immediate rash, facial/tongue/throat swelling, SOB or lightheadedness with hypotension: Yes Has patient had a PCN reaction causing severe rash involving mucus membranes or skin necrosis: No Has patient had a PCN reaction that required hospitalization: No Has patient had a PCN reaction occurring within the last 10 years: No If all of the above answers are "NO", then may proceed with Cephalosporin use.  . Simvastatin Hives  . Tramadol Swelling  . Tylenol [Acetaminophen] Other (See Comments)    "it messes with my liver and my stomach"  pain    Patient Measurements: Height: _0  (172.7 cm) Weight: 140 lb 10.5 oz (63.8 kg) IBW/kg (Calculated) : 68.4 Heparin Dosing Weight: 60 kg  Vital Signs: Temp: 98.1 F (36.7 C) (09/29 0800) Temp Source: Oral (09/29 0800) BP: 95/59 (09/29 0900) Pulse Rate: 82 (09/29 0900)  Labs: Recent Labs    06/14/2018 1911 07-20-18 0013 07/20/2018 0313 Jul 20, 2018 0314 Jul 20, 2018 0915  HGB 13.5 12.3*  --  13.6  --   HCT 41.2 36.2*  --  37.7*  --   PLT 328 286  --  303  --   APTT <24*  --  60*  --   --   LABPROT 13.8  --   --   --   --   INR 1.07  --   --   --   --   HEPARINUNFRC <0.10*  --  0.33  --  0.10*  CREATININE 1.44* 1.41* 1.07  --   --   TROPONINI 4.85*  --   --   --   --     Estimated Creatinine Clearance: 67.9 mL/min (by C-G formula based on SCr of 1.07 mg/dL).   Medical History: Past Medical History:  Diagnosis Date  . Acute MI ([place])   . CHF (congestive heart failure) ([place])   . Diabetes mellitus without complication ([place])   . H/O blood clots   . Hyperlipemia   . Hypertension   . Left kidney mass      Medications:  Scheduled:  . chlorhexidine gluconate (MEDLINE KIT)  15 mL Mouth Rinse BID  . docusate sodium  100 mg Oral BID  . insulin aspart  0-15 Units Subcutaneous Q4H  . mouth rinse  15 mL Mouth Rinse 10 times per day  . [COMPLETED] midazolam        Assessment: Patient admitted for CP and abdominal pain. 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 Initial trops 5.37 >> 4.85 EKG showing afib/flutter w/ V-paced complexes. Patient is being started on heparin drip for afib/flutter/h/o mural thrombus/and possible NSTEMI  Goal of Therapy:  Heparin level 0.3-0.7 units/ml Monitor platelets by anticoagulation protocol: Yes   Plan:  09/29 @ 0915 HL 0.10. Level is subtherapeutic. Will order heparin 1800 unit bolus and increase infusion to heparin 1150 units/hr. Will recheck HL in 6 hours. CBC with AM labs per protocol.  Pernell Dupre, PharmD, BCPS Clinical Pharmacist 07-20-2018 10:23 AM

## 2018-07-01 NOTE — H&P (Signed)
NP came to room to see patient's progress. Everything looks good except cbg dropped and told her it went down to 65 and was using gulcostabilizer to dose the D50. NP acknowledged. CBG now 80. Stabilizer wants insulin gtt restarted at 0.4, will leave off for another hour and recheck. Informed day shift and will notify MD.

## 2018-07-01 NOTE — Progress Notes (Signed)
ANTICOAGULATION CONSULT NOTE - Initial Consult  Pharmacy Consult for heparin Indication: chest pain/ACS/afib/flutter/h/o mural thrombus suppose to be on eliquis but h/o homelessness/non-compliance  Allergies  Allergen Reactions  . Codeine Swelling  . Fish Oil Hives  . Penicillins Swelling    Has patient had a PCN reaction causing immediate rash, facial/tongue/throat swelling, SOB or lightheadedness with hypotension: Yes Has patient had a PCN reaction causing severe rash involving mucus membranes or skin necrosis: No Has patient had a PCN reaction that required hospitalization: No Has patient had a PCN reaction occurring within the last 10 years: No If all of the above answers are "NO", then may proceed with Cephalosporin use.  . Simvastatin Hives  . Tramadol Swelling  . Tylenol [Acetaminophen] Other (See Comments)    "it messes with my liver and my stomach"  pain    Patient Measurements: Height: 5' 8" (172.7 cm) Weight: 140 lb 10.5 oz (63.8 kg) IBW/kg (Calculated) : 68.4 Heparin Dosing Weight: 60 kg  Vital Signs: Temp: 98 F (36.7 C) (09/29 0400) Temp Source: Axillary (09/29 0400) BP: 104/59 (09/29 0635) Pulse Rate: 85 (09/29 0635)  Labs: Recent Labs    06/26/2018 1911 07/07/18 0013 Jul 07, 2018 0313 07/07/2018 0314  HGB 13.5 12.3*  --  13.6  HCT 41.2 36.2*  --  37.7*  PLT 328 286  --  303  APTT <24*  --  60*  --   LABPROT 13.8  --   --   --   INR 1.07  --   --   --   HEPARINUNFRC <0.10*  --  0.33  --   CREATININE 1.44* 1.41* 1.07  --   TROPONINI 4.85*  --   --   --     Estimated Creatinine Clearance: 67.9 mL/min (by C-G formula based on SCr of 1.07 mg/dL).   Medical History: Past Medical History:  Diagnosis Date  . Acute MI ([place])   . CHF (congestive heart failure) ([place])   . Diabetes mellitus without complication ([place])   . H/O blood clots   . Hyperlipemia   . Hypertension   . Left kidney mass     Medications:  Scheduled:  . chlorhexidine gluconate  (MEDLINE KIT)  15 mL Mouth Rinse BID  . docusate sodium  100 mg Oral BID  . insulin aspart  0-15 Units Subcutaneous TID WC  . insulin aspart  0-5 Units Subcutaneous QHS  . insulin glargine  35 Units Subcutaneous Daily  . ipratropium-albuterol  3 mL Nebulization Q6H  . mouth rinse  15 mL Mouth Rinse 10 times per day  . metoprolol tartrate  2.5 mg Intravenous Q6H  . [COMPLETED] midazolam        Assessment: Patient admitted for CP and abdominal pain. 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 Initial trops 5.37 >> 4.85 EKG showing afib/flutter w/ V-paced complexes. Patient is being started on heparin drip for afib/flutter/h/o mural thrombus/and possible NSTEMI  Goal of Therapy:  Heparin level 0.3-0.7 units/ml Monitor platelets by anticoagulation protocol: Yes   Plan:  09/29 @ 0300 HL 0.33 therapeutic. Will continue rate at 900 units/hr and will recheck HL @ 0900, CBC stable.  Tobie Lords, PharmD, BCPS Clinical Pharmacist 07-07-18

## 2018-07-01 NOTE — Progress Notes (Signed)
   Jul 19, 2018 1550  Clinical Encounter Type  Visited With Patient not available;Family;Health care provider  Visit Type Code  Spiritual Encounters  Spiritual Needs Emotional;Grief support;Prayer  Stress Factors  Family Stress Factors Family relationships;Loss   Chaplain responded to code blue, introduced self to patient's sister and brother.  Chaplain maintained a calming presence offering silent and energetic prayers for patient, family and care team.  Chaplain remained present offering family emotional support as medical staff engaged them through the code and through patient's death.  Chaplain offered emotional and bereavement support, holding space and utilizing active and reflective listening as family processed patient's life choices and the dynamics of their relationships with patient and the patient's son relationship to family.  Chaplain escorted family out as they intend to attempt to locate patient's son to inform of patient's death.

## 2018-07-01 NOTE — Progress Notes (Signed)
Hypoglycemic Event  CBG: 69    Treatment: D50 IV 25 mL  Symptoms: None  Follow-up CBG: Time:0830 CBG Result:97  Possible Reasons for Event: Other: insulin infusion  Comments/MD notified:yes    Claude Manges

## 2018-07-01 NOTE — Consult Note (Signed)
PULMONARY / CRITICAL CARE MEDICINE   Name: Mario Gallagher MRN: [id number] DOB: 05-29-1961    ADMISSION DATE:  06/20/2018   CONSULTATION DATE: 2018/07/17  REASON FOR CONSULT: CARDIAC ARREST  Brief summary: 57 year old male with a history of polysubstance abuse, medication nonadherence, ischemic cardiomyopathy with an EF of 15%, biventricular AICD, hypertension and MI in 2012 with PCI to the LAD, left circumflex and RCA admitted VT with multiple AICD shocks, brief episode of cardiac arrest post intubation, cardiogenic shock, bilateral lower lobe pneumonia and DKA  HISTORY OF PRESENT ILLNESS:   This is a 57 y/o male with a PMH as indicated below presented to the ED via EMS with complaints of midsternal chest pressure and heaviness that started about an hour ago.  History is obtained from ED records and EMS records as patient is currently intubated and sedated.  When EMS arrived, patient was diaphoretic, skin was cool and clammy and his blood glucose was reading high.  Per EMS, his defibrillator went off twice.  When he arrived in the ED, he was shocked multiple times by his defibrillator, became profusely diaphoretic and obtunded hence he was intubated emergently.  At the time of intubation, patient had received about 15 shocks.  Post intubation, patient went into a brief episode of pulselessness and received 1 dose of epinephrine with successful return of spontaneous circulation.  His blood sugar was 961MG/DL,  his lactic acid was 8.1,  his troponin was 4.85 and his chest x-ray showed bilateral lower lobe lung opacities.  Cardiology was consulted and he was started on amiodarone bolus and the infusion. Upon arrival in the ICU, patient became hypotensive with systolic blood pressures in the 60s.  He was started on dopamine infusion. AICD interrogation in the ED revealed 21 shocks. Of note, she was seen in the ED on 06/25/2018 with chest pain and his troponin was noted to be elevated but he is signed out  AMA.  Had multiple ED visits recently for chest pain, congestive heart failure, ventricular tachycardia and respiratory failure secondary to CHF.  Significant events:  9/29: Admitted to the ICU  Past Medical History:  Diagnosis Date  . Acute MI ([place])   . CHF (congestive heart failure) ([place])   . Diabetes mellitus without complication ([place])   . H/O blood clots   . Hyperlipemia   . Hypertension   . Left kidney mass    Past Surgical History:  Procedure Laterality Date  . CARDIAC DEFIBRILLATOR PLACEMENT    . HERNIA REPAIR     Social History   Socioeconomic History  . Marital status: Single    Spouse name: Not on file  . Number of children: Not on file  . Years of education: Not on file  . Highest education level: Not on file  Occupational History  . Not on file  Social Needs  . Financial resource strain: Not on file  . Food insecurity:    Worry: Not on file    Inability: Not on file  . Transportation needs:    Medical: Not on file    Non-medical: Not on file  Tobacco Use  . Smoking status: Current Every Day Smoker    Packs/day: 1.00    Types: Cigarettes  . Smokeless tobacco: Never Used  Substance and Sexual Activity  . Alcohol use: No  . Drug use: Yes    Types: Cocaine    Comment: smoked on 06/24/2018  . Sexual activity: Not on file  Lifestyle  . Physical activity:  Days per week: Not on file    Minutes per session: Not on file  . Stress: Not on file  Relationships  . Social connections:    Talks on phone: Not on file    Gets together: Not on file    Attends religious service: Not on file    Active member of club or organization: Not on file    Attends meetings of clubs or organizations: Not on file    Relationship status: Not on file  . Intimate partner violence:    Fear of current or ex partner: Not on file    Emotionally abused: Not on file    Physically abused: Not on file    Forced sexual activity: Not on file  Other Topics Concern  . Not on file   Social History Narrative  . Not on file   Allergies  Allergen Reactions  . Codeine Swelling  . Fish Oil Hives  . Penicillins Swelling    Has patient had a PCN reaction causing immediate rash, facial/tongue/throat swelling, SOB or lightheadedness with hypotension: Yes Has patient had a PCN reaction causing severe rash involving mucus membranes or skin necrosis: No Has patient had a PCN reaction that required hospitalization: No Has patient had a PCN reaction occurring within the last 10 years: No If all of the above answers are "NO", then may proceed with Cephalosporin use.  . Simvastatin Hives  . Tramadol Swelling  . Tylenol [Acetaminophen] Other (See Comments)    "it messes with my liver and my stomach"  pain   MEDICATIONS  Current Facility-Administered Medications:  .  0.9 %  sodium chloride infusion, , Intravenous, Continuous, Amelia Jo, MD, Last Rate: 75 mL/hr at 2018-07-16 0100 .  acetaminophen (TYLENOL) tablet 650 mg, 650 mg, Oral, Q6H PRN **OR** acetaminophen (TYLENOL) suppository 650 mg, 650 mg, Rectal, Q6H PRN, Amelia Jo, MD .  Margrett Rud amiodarone (NEXTERONE) 1.8 mg/mL load via infusion 150 mg, 150 mg, Intravenous, Once, 150 mg at 06/24/2018 1935 **FOLLOWED BY** amiodarone (NEXTERONE PREMIX) 360-4.14 MG/200ML-% (1.8 mg/mL) IV infusion, 60 mg/hr, Intravenous, Continuous, Stopped at 06/12/2018 1944 **FOLLOWED BY** amiodarone (NEXTERONE PREMIX) 360-4.14 MG/200ML-% (1.8 mg/mL) IV infusion, 30 mg/hr, Intravenous, Continuous, Amelia Jo, MD .  bisacodyl (DULCOLAX) EC tablet 5 mg, 5 mg, Oral, Daily PRN, Amelia Jo, MD .  chlorhexidine gluconate (MEDLINE KIT) (PERIDEX) 0.12 % solution 15 mL, 15 mL, Mouth Rinse, BID, Tukov-Yual, Kynzee Devinney S, NP, 15 mL at 07/16/18 0006 .  docusate sodium (COLACE) capsule 100 mg, 100 mg, Oral, BID, Amelia Jo, MD, Stopped at 06/24/2018 2352 .  DOPamine (INTROPIN) 800 mg in dextrose 5 % 250 mL (3.2 mg/mL) infusion, 0-20 mcg/kg/min,  Intravenous, Titrated, Tukov-Yual, Blandon Offerdahl S, NP, Last Rate: 10.97 mL/hr at 2018-07-16 0100, 10 mcg/kg/min at 16-Jul-2018 0100 .  famotidine (PEPCID) IVPB 20 mg premix, 20 mg, Intravenous, Q12H, Tukov-Yual, Keirstan Iannello S, NP, Stopped at 07-16-2018 0048 .  fentaNYL 2567mg in NS 2545m(1046mml) infusion-PREMIX, 0-400 mcg/hr, Intravenous, Continuous, MaiAmelia JoD, Last Rate: 20 mL/hr at 09/10-16-1900, 200 mcg/hr at 09/October 16, 201900 .  heparin ADULT infusion 100 units/mL (25000 units/250m14mdium chloride 0.45%), 900 Units/hr, Intravenous, Continuous, MaieAmelia Jo, Last Rate: 9 mL/hr at 09/216-Oct-20190, 900 Units/hr at 09/2October 16, 20190 .  HYDROcodone-acetaminophen (NORCO/VICODIN) 5-325 MG per tablet 1-2 tablet, 1-2 tablet, Oral, Q4H PRN, MaieAmelia Jo .  insulin aspart (novoLOG) injection 0-15 Units, 0-15 Units, Subcutaneous, TID WC, MaieAmelia Jo .  insulin aspart (novoLOG) injection 0-5 Units, 0-5 Units, Subcutaneous,  Scharlene Corn, MD, Stopped at 07/14/18 0007 .  insulin glargine (LANTUS) injection 35 Units, 35 Units, Subcutaneous, Daily, Amelia Jo, MD, 35 Units at 06/11/2018 2340 .  insulin regular (NOVOLIN R,HUMULIN R) 100 Units in sodium chloride 0.9 % 100 mL (1 Units/mL) infusion, , Intravenous, Continuous, Lavonia Drafts, MD, Last Rate: 15.7 mL/hr at Jul 14, 2018 0102, 15.7 Units/hr at 2018/07/14 0102 .  ipratropium-albuterol (DUONEB) 0.5-2.5 (3) MG/3ML nebulizer solution 3 mL, 3 mL, Nebulization, Q6H, Amelia Jo, MD .  MEDLINE mouth rinse, 15 mL, Mouth Rinse, 10 times per day, Tukov-Yual, Kinjal Neitzke S, NP .  metoprolol tartrate (LOPRESSOR) injection 2.5 mg, 2.5 mg, Intravenous, Q6H, Amelia Jo, MD, Stopped at 2018-07-14 0054 .  [COMPLETED] midazolam (VERSED) 2 MG/2ML injection, , , ,  .  midazolam (VERSED) 50 mg in sodium chloride 0.9 % 50 mL (1 mg/mL) infusion, 0.5 mg/hr, Intravenous, Continuous, Tukov-Yual, Olivette Beckmann S, NP, Last Rate: 0.5 mL/hr at Jul 14, 2018 0108, 0.5 mg/hr at Jul 14, 2018  0108 .  norepinephrine (LEVOPHED) 10m in D5W 2568mpremix infusion, 0-40 mcg/min, Intravenous, Titrated, Tukov-Yual, Hanna Aultman S, NP, Stopped at 0910/14/2019003 .  ondansetron (ZOFRAN) tablet 4 mg, 4 mg, Oral, Q6H PRN **OR** ondansetron (ZOFRAN) injection 4 mg, 4 mg, Intravenous, Q6H PRN, MaAmelia JoMD  REVIEW OF SYSTEMS: Unable to obtain as patient is intubated and sedated  VITAL SIGNS: BP (!) 121/59   Pulse (!) 104   Temp (!) 97 F (36.1 C) (Axillary)   Resp 15   Ht 5' 8"  (1.727 m)   Wt 62.7 kg   SpO2 98%   BMI 21.02 kg/m   HEMODYNAMICS:    VENTILATOR SETTINGS: Vent Mode: PRVC FiO2 (%):  [40 %-50 %] 40 % Set Rate:  [16 bmp] 16 bmp Vt Set:  [500 mL] 500 mL PEEP:  [5 cmH20] 5 cmH20 Plateau Pressure:  [13 cmH20] 13 cmH20  INTAKE / OUTPUT: No intake/output data recorded.  PHYSICAL EXAMINATION: General: Acutely ill looking HEENT: ET tube in place, pupils are pinpoint, sluggish, trachea midline, moderate elevation in JVD Neuro: Sedated, moves all extremities, positive corneal and gag reflexes Cardiovascular: Paced rhythm, regular, S1-S2, +1 edema bilaterally, +2 pulses in bilateral upper extremities, +1 pulses in bilateral lower extremities Lungs: Bilateral breath sounds with coarse rhonchi in anterior lung fields, diminished in the bases Abdomen: Nondistended, mild bowel sounds, palpation reveals no organomegaly Musculoskeletal: No joint deformities, positive range of motion in bilateral upper and lower extremities Skin: Mild venous stasis discoloration in bilateral lower extremities, no lesions  LABS:  BMET Recent Labs  Lab 06/25/18 0456 06/30/2018 1911 0914-Oct-2019013  NA 128* 126* 130*  K 5.1 5.1 4.0  CL 90* 90* 95*  CO2 27 19* 26  BUN 25* 20 25*  CREATININE 1.11 1.44* 1.41*  GLUCOSE 512* 961* 639*    Electrolytes Recent Labs  Lab 06/25/18 0456 06/25/2018 1911 0910/14/19013  CALCIUM 8.9 8.3* 8.4*  MG  --  2.1  --     CBC Recent Labs  Lab  06/25/18 0456 06/09/2018 1911 0914-Oct-2019013  WBC 11.7* 12.9* 19.9*  HGB 13.6 13.5 12.3*  HCT 39.2* 41.2 36.2*  PLT 269 328 286    Coag's Recent Labs  Lab 06/22/18 0722 06/25/18 0725 06/16/2018 1911  APTT 31 28 <24*  INR 1.02 1.07 1.07    Sepsis Markers Recent Labs  Lab 06/19/2018 1912 06/17/2018 2201  LATICACIDVEN 8.1* 6.8*    ABG Recent Labs  Lab 06/22/18 0759 0910/14/19028  PHART 7.45 7.31*  PCO2ART  37 50*  PO2ART 42* 91    Liver Enzymes Recent Labs  Lab 06/01/2018 1911 Jun 30, 2018 0013  AST 130* 478*  ALT 59* 215*  ALKPHOS 503* 682*  BILITOT 1.1 0.8  ALBUMIN 3.2* 3.1*    Cardiac Enzymes Recent Labs  Lab 06/25/18 1456 06/25/18 2221 06/02/2018 1911  TROPONINI 4.19* 5.37* 4.85*    Glucose Recent Labs  Lab 06/03/2018 1905 06/19/2018 2018 06/11/2018 2146 06/27/2018 2257 06/03/2018 2331 Jun 30, 2018 0102  GLUCAP >600* >600* >600* >600* >600* 452*    Imaging Dg Abdomen 1 View  Result Date: 06/02/2018 CLINICAL DATA:  OG tube placement EXAM: ABDOMEN - 1 VIEW COMPARISON:  06/03/2018 FINDINGS: Partially visualized cardiac pacing leads. Esophageal tube tip terminates at the distal esophagus. Small pleural effusions, cardiomegaly, consolidation at the left base. Tip of the right central venous catheter overlies the cavoatrial region IMPRESSION: Tip of the esophageal tube overlies the distal esophagus, further advancement suggested for more optimal positioning. Electronically Signed   By: Donavan Foil M.D.   On: 06/23/2018 21:50   Dg Chest Portable 1 View  Result Date: 06/11/2018 CLINICAL DATA:  Central line placement and intubated patient EXAM: PORTABLE CHEST 1 VIEW COMPARISON:  06/21/2018, 06/26/2018, 06/25/2018 FINDINGS: Left-sided pacing device as before. Endotracheal tube tip is about 3.9 cm superior to the carina. Esophageal tube tip projects over the distal esophagus. Right IJ central venous catheter tip at the cavoatrial junction. No pneumothorax. Cardiomegaly with  vascular congestion and pulmonary edema. Small pleural effusions. Consolidation at the left lung base. IMPRESSION: 1. Endotracheal tube tip about 3.9 cm superior to the carina. 2. Right IJ central venous catheter tip overlies the cavoatrial junction. No pneumothorax 3. Cardiomegaly with small pleural effusion, vascular congestion and pulmonary edema. Worsening consolidation at the left lung base. Electronically Signed   By: Donavan Foil M.D.   On: 06/26/2018 21:49   Dg Chest Portable 1 View  Result Date: 06/11/2018 CLINICAL DATA:  Chest heaviness/pressure EXAM: PORTABLE CHEST 1 VIEW COMPARISON:  06/26/2018 FINDINGS: Cardiomegaly with mild interstitial edema. Mild patchy right lower lobe opacity, worrisome for pneumonia. Left basilar opacity, likely atelectasis. Small left pleural effusion. Left subclavian ICD. IMPRESSION: Cardiomegaly with mild interstitial edema and small left pleural effusion. Mild patchy right lower lobe opacity, worrisome for pneumonia. Left basilar opacity, likely atelectasis. Electronically Signed   By: Julian Hy M.D.   On: 06/11/2018 19:27   STUDIES:  None  CULTURES: Blood cultures x2  ANTIBIOTICS: Given Levaquin in the ED Started on cefepime and vancomycin  LINES/TUBES: Peripheral IVs Right IJ ET tube Foley catheter  DISCUSSION: 57 year old male with polysubstance abuse presenting with VT storm with subsequent cardiac arrest and respiratory failure requiring intubation, bilateral lower lobe pneumonia, acute on chronic systolic heart failure and DKA  ASSESSMENT / PLAN:  PULMONARY A: Acute respiratory failure secondary to VT and cardiac arrest HCAP Severe lactic acidosis P:   Full vent support with current settings ABG post intubation reviewed Nebulized bronchodilators VAP protocol Chest x-ray and ABG in the morning and as needed Weaning trials as tolerated  CARDIOVASCULAR A:  VT storm CAD status post MI with PCI to multiple vessels Acute on  chronic end-stage systolic failure heart failure Cardiogenic shock requiring pressors History of left ventricular mural thrombus Elevated troponin-4.85 P:  Hemodynamic monitoring per ICU protocol Cardiology following Amiodarone infusion per cardiology Trend cardiac enzymes Heparin infusion per cardiology IV metoprolol if systolic blood pressure allows and if of pressors Continue statin Avoid IV fluids Resume home dose of  lisinopril and Spironolactone when blood pressure improves Per cardiology, avoid propofol for sedation given severely reduced left ventricular ejection fraction Palliative care consult  RENAL A:   AKI with creatinine of 1.4 P:   Trend creatinine Monitor and correct electrolyte imbalances  GASTROINTESTINAL A:   No acute issues P:   Initiate tube feeds per ICU protocol  HEMATOLOGIC A:   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  Will update when available   Best Practice: Code Status:  Full code Diet: N.p.o. GI prophylaxis: Pepcid VTE prophylaxis:  SCD's / heparin.  Adley Mazurowski S. [facility] ANP-BC Pulmonary and Critical Care Medicine [facility] Pager [phone] or [phone]  NB: This document was prepared using Dragon voice recognition software and may include unintentional dictation errors.    07-18-18, 1:11 AM

## 2018-07-01 NOTE — Progress Notes (Signed)
The patient self extubated and was put on BiPAP. CODE BLUE was code at about 15:50 due to cardiac arrest, CPR was started and the patient was reintubated by Dr. Derrill Kay.  Patient was treated with epinephrine and shock. I discussed with the patient's sister and brother about the patient's critical condition and very poor prognosis.  The brother said he cannot make a decision to withdraw CPR.  The sister also cannot decide.  CODE BLUE lasted about 20 minutes.  The patient died at 16:08.  I discussed with Dr. Derrill Kay. I discussed with Dr. Sung Amabile on the phone. Critical care spent about 35 minutes.

## 2018-07-01 NOTE — Progress Notes (Signed)
[employer] - [place] at [facility]   PATIENT NAME: Mario Gallagher    MR#:  [id number]  DATE OF BIRTH:  1961/06/24  SUBJECTIVE:  CHIEF COMPLAINT:   Chief Complaint  Patient presents with  . Chest Pain  . Abdominal Pain   The patient is intubated and on ventilation.  On dopamine drip due to hypotension. REVIEW OF SYSTEMS:  Review of Systems  Unable to perform ROS: Intubated    DRUG ALLERGIES:   Allergies  Allergen Reactions  . Codeine Swelling  . Fish Oil Hives  . Penicillins Swelling    Has patient had a PCN reaction causing immediate rash, facial/tongue/throat swelling, SOB or lightheadedness with hypotension: Yes Has patient had a PCN reaction causing severe rash involving mucus membranes or skin necrosis: No Has patient had a PCN reaction that required hospitalization: No Has patient had a PCN reaction occurring within the last 10 years: No If all of the above answers are "NO", then may proceed with Cephalosporin use.  . Simvastatin Hives  . Tramadol Swelling  . Tylenol [Acetaminophen] Other (See Comments)    "it messes with my liver and my stomach"  pain   VITALS:  Blood pressure (!) 95/59, pulse 82, temperature 98.1 F (36.7 C), temperature source Oral, resp. rate 14, height 5\' 8"  (1.727 m), weight 63.8 kg, SpO2 98 %. PHYSICAL EXAMINATION:  Physical Exam  HENT:  Head: Normocephalic.  Eyes: No scleral icterus.  Bilateral pupils are pinpoint.  Neck: No JVD present. No tracheal deviation present.  Cardiovascular: Normal rate, regular rhythm and normal heart sounds. Exam reveals no gallop.  No murmur heard. Pulmonary/Chest: Effort normal and breath sounds normal. No respiratory distress. He has no wheezes. He has no rales.  Abdominal: Soft. Bowel sounds are normal. He exhibits no distension. There is no tenderness. There is no rebound.  Musculoskeletal: He exhibits no edema or tenderness.  Neurological:  Unable to exam.  Skin: No rash noted.  No erythema.   LABORATORY PANEL:  Male CBC Recent Labs  Lab 06/25/2018 0314  WBC 21.1*  HGB 13.6  HCT 37.7*  PLT 303   ------------------------------------------------------------------------------------------------------------------ Chemistries  Recent Labs  Lab 06/25/2018 0013 06/15/2018 0309 06/14/2018 0313  NA 130*  --  134*  K 4.0  --  3.9  CL 95*  --  97*  CO2 26  --  28  GLUCOSE 639*  --  339*  BUN 25*  --  23*  CREATININE 1.41*  --  1.07  CALCIUM 8.4*  --  8.6*  MG  --  2.6*  --   AST 478*  --   --   ALT 215*  --   --   ALKPHOS 682*  --   --   BILITOT 0.8  --   --    RADIOLOGY:  Dg Abd 1 View  Result Date: 06/05/2018 CLINICAL DATA:  57 year old male with NG tube placement. EXAM: ABDOMEN - 1 VIEW COMPARISON:  Radiograph dated 15-Jul-2018 FINDINGS: An enteric tube is partially visualized with tip and side-port in the left upper abdomen likely in the proximal stomach. Mildly dilated air-filled loops of small bowel in the lower abdomen and pelvis measure up to 3 cm. IMPRESSION: Enteric tube with tip likely in the proximal stomach. Electronically Signed   By: Elgie Collard M.D.   On: 06/02/2018 01:42   Dg Abdomen 1 View  Result Date: 15-Jul-2018 CLINICAL DATA:  OG tube placement EXAM: ABDOMEN - 1 VIEW COMPARISON:  07-15-2018 FINDINGS:  Partially visualized cardiac pacing leads. Esophageal tube tip terminates at the distal esophagus. Small pleural effusions, cardiomegaly, consolidation at the left base. Tip of the right central venous catheter overlies the cavoatrial region IMPRESSION: Tip of the esophageal tube overlies the distal esophagus, further advancement suggested for more optimal positioning. Electronically Signed   By: Jasmine Pang M.D.   On: Jul 12, 2018 21:50   Portable Chest Xray  Result Date: 06/25/2018 CLINICAL DATA:  Respiratory failure EXAM: PORTABLE CHEST 1 VIEW COMPARISON:  2018/07/12 FINDINGS: Endotracheal tube NG tube and central venous line unchanged. Stable  cardiac silhouette. There is bilateral pleural effusions which are slightly increased. No pulmonary edema. No pneumothorax. Central venous congestion is present. IMPRESSION: 1. Stable support apparatus. 2. Increase in bilateral pleural effusions. 3. Persistent central venous congestion. Electronically Signed   By: Genevive Bi M.D.   On: 06/01/2018 08:33   Dg Chest Portable 1 View  Result Date: 12-Jul-2018 CLINICAL DATA:  Central line placement and intubated patient EXAM: PORTABLE CHEST 1 VIEW COMPARISON:  07-12-2018, 06/26/2018, 06/25/2018 FINDINGS: Left-sided pacing device as before. Endotracheal tube tip is about 3.9 cm superior to the carina. Esophageal tube tip projects over the distal esophagus. Right IJ central venous catheter tip at the cavoatrial junction. No pneumothorax. Cardiomegaly with vascular congestion and pulmonary edema. Small pleural effusions. Consolidation at the left lung base. IMPRESSION: 1. Endotracheal tube tip about 3.9 cm superior to the carina. 2. Right IJ central venous catheter tip overlies the cavoatrial junction. No pneumothorax 3. Cardiomegaly with small pleural effusion, vascular congestion and pulmonary edema. Worsening consolidation at the left lung base. Electronically Signed   By: Jasmine Pang M.D.   On: 07/12/18 21:49   Dg Chest Portable 1 View  Result Date: 07/12/2018 CLINICAL DATA:  Chest heaviness/pressure EXAM: PORTABLE CHEST 1 VIEW COMPARISON:  06/26/2018 FINDINGS: Cardiomegaly with mild interstitial edema. Mild patchy right lower lobe opacity, worrisome for pneumonia. Left basilar opacity, likely atelectasis. Small left pleural effusion. Left subclavian ICD. IMPRESSION: Cardiomegaly with mild interstitial edema and small left pleural effusion. Mild patchy right lower lobe opacity, worrisome for pneumonia. Left basilar opacity, likely atelectasis. Electronically Signed   By: Charline Bills M.D.   On: 2018/07/12 19:27   ASSESSMENT AND PLAN:   1.  VT  storm, likely multifactorial, secondary to severe systolic heart failure, ischemic cardiomyopathy, cocaine use, DKA and electrolyte abnormalities.   Continue amiodarone drip and hold metoprolol in the setting of hypotension requiring dopamine Dr. Okey Dupre. ICD was interrogated and found to be appropriately functioning.  2.  Cardiac arrest, likely secondary to VT and severe ischemic cardiomyopathy while abusing cocaine.  On sedation and vent support. Dr. Okey Dupre, EP consultation if recurrent VT.  Cardiogenic hypotension.  Continue dopamine drip.  3.  DKA,  Improved with IV insulin and gentle IV hydration.    4.  HCAP, Continue IV cefepime and vancomycin.  Continue vent support and nebulizer treatment as needed.  5.  Acute respiratory failure, secondary to cardiac arrest, continue vent support and pneumonia treatment.  6.  Elevated troponin level, likely secondary to demand ischemia,   Continue heparin drip. Beta-blocker and statin are recommended when patient is able to take them.  7.  Severe coronary artery disease, not candidate for CABG, due to his noncompliance with treatments and drug abuse.  Continue heparin drip.    8.  Acute on chronic systolic heart failure.  Per most recent 2D echo, ejection fraction is severely reduced at 15%.  Patient is not a candidate  for advanced therapies.  Avoid aggressive fluid resuscitation.   Continue treatment recommended by cardiologist.  9.  History of LV mural thrombus.  Patient was supposed to be on Eliquis for this, however he has not been compliant with his medications.  On heparin drip.  Very prognosis, may die in the hospital, palliative care consult. All the records are reviewed and case discussed with Care Management/Social Worker. Management plans discussed with the patient, family and they are in agreement.  CODE STATUS: Full Code  TOTAL TIME TAKING CARE OF THIS PATIENT: 33 minutes.   More than 50% of the time was spent in  counseling/coordination of care: YES  POSSIBLE D/C IN ? DAYS, DEPENDING ON CLINICAL CONDITION.   Shaune Pollack M.D on 06/25/2018 at 12:22 PM  Between 7am to 6pm - Pager - [phone]  After 6pm go to [email or web address] - Therapist, [facility] Hospitalists

## 2018-07-01 NOTE — Progress Notes (Signed)
PT awakened suddenly approx 1330.  His sedation had been off approx 1.5 hours and he had been very minimally responsive, barely fluttering eyelids when name was called.  When he awakened he tried to sit up in bed and pulled ETT partly out.  Because he was intubated as a result of VTach arrest in ED and respiratory status was acceptable the decision was made to remove ETT and place him on BiPap.  He tolerated this very well.   His family was contacted by hospitalist Imogene Burn who told them the patient's cardiac condition was very grave and they needed to come to the hospital to make some decisions.  Writing RN talked to pt's brother Mario Gallagher at length in the patient's room and he stated patient had been in and out of prison and on cocaine for most of his life and they were all aware "he is living on borrowed time."  He also stated the patient would leave the hospital as soon as he could "get up and walk."   Patient was repeatedly sitting up in bed and attempting to get up during this time and had to be redirected repeatedly by writing RN.  Gave him 1 mg PRN Lorazepam and he calmed somewhat and lay down in bed.   His sister entered the room approximately 1540 and began talking to Mario Gallagher re her phone call with Dr Imogene Burn and stated that she felt the patient should go to the [facility] but she did not think he would stay there.  Mario Gallagher agreed.   Writing RN then observed that pt's coloring had become cyanotic and while the pacer was still spiking I could not palpate a carotid pulse.  Code Blue activated and I started compressions (defibrillator pads already in place).  I asked Mario Gallagher briefly while I began compressions if he did want [place] to code his brother and he could not decide, therefore code blue was performed from approximately 1550, with 5 rounds epi administered,  Pt was reintubated by RTs, Code Blue sheet completed.  Pacer continued to fire intermittently, shocking the patient, but ROSC was not achieved.  Patient  was pronounced by the ED physician at 1608.  Family was in the hallway and kept informed by Dr Imogene Burn and the ED physician and the [facility].  They were very appreciative of the efforts made by the staff and stated they were grateful that we understood they just could not make that kind of decision in the moment.

## 2018-07-01 DEATH — deceased

## 2018-07-02 NOTE — ED Provider Notes (Signed)
[facility] Emergency Department Provider Note    First MD Initiated Contact with Patient 06/25/18 0630     (approximate)  I have reviewed the triage vital signs and the nursing notes.   HISTORY  Chief Complaint Chest Pain    HPI Mario Gallagher is a 57 y.o. male with below list of chronic medical conditions including hypertension hyperlipidemia diabetes mellitus congestive heart failure and cocaine abuse presents to the emergency department with 9 out of 10 central chest pain that radiates to the left chest with associated dyspnea.  Patient states pain began while he was walking.  Patient denies any nausea vomiting dizziness or diaphoresis.  Of note patient was discharged from the hospital on 06/23/2018 secondary to elevated troponin.   Past Medical History:  Diagnosis Date  . Acute MI (HCC)   . CHF (congestive heart failure) (HCC)   . Diabetes mellitus without complication (HCC)   . H/O blood clots   . Hyperlipemia   . Hypertension   . Left kidney mass     Patient Active Problem List   Diagnosis Date Noted  . Acute respiratory failure (HCC) 06/20/2018  . Cocaine abuse (HCC) 06/25/2018  . CAD in native artery 06/25/2018  . Chronic combined systolic and diastolic CHF (congestive heart failure) (HCC) 06/25/2018  . Mural thrombus of left ventricular apex without MI 06/25/2018  . Chest pain 06/22/2018  . H/O noncompliance with medical treatment, presenting hazards to health 06/19/2018  . Acute on chronic systolic (congestive) heart failure (HCC) 05/19/2018    Past Surgical History:  Procedure Laterality Date  . CARDIAC DEFIBRILLATOR PLACEMENT    . HERNIA REPAIR      Prior to Admission medications   Medication Sig Start Date End Date Taking? Authorizing Provider  albuterol (PROVENTIL HFA;VENTOLIN HFA) 108 (90 Base) MCG/ACT inhaler Inhale 2 puffs into the lungs every 6 (six) hours as needed for wheezing or shortness of breath. 06/23/18  Yes Pyreddy,  Vivien Rota, MD  amiodarone (PACERONE) 400 MG tablet Take 1 tablet (400 mg total) by mouth daily. Patient taking differently: Take 200 mg by mouth daily.  05/21/18  Yes Mayo, Allyn Kenner, MD  apixaban (ELIQUIS) 5 MG TABS tablet Take 5 mg by mouth 2 (two) times daily.   Yes [provider]  aspirin 81 MG chewable tablet Chew 1 tablet (81 mg total) by mouth daily. 05/21/18  Yes Mayo, Allyn Kenner, MD  carvedilol (COREG) 3.125 MG tablet Take 4 tablets (12.5 mg total) by mouth 2 (two) times daily with a meal. 06/20/18  Yes Enedina Finner, MD  clonazePAM (KLONOPIN) 0.5 MG tablet Take 0.5 mg by mouth 2 (two) times daily as needed for anxiety.    Yes [provider]  furosemide (LASIX) 20 MG tablet Take 1 tablet (20 mg total) by mouth daily. 05/21/18  Yes Mayo, Allyn Kenner, MD  gabapentin (NEURONTIN) 800 MG tablet Take 800 mg by mouth 2 (two) times daily.    Yes [provider]  insulin NPH Human (HUMULIN N,NOVOLIN N) 100 UNIT/ML injection Inject 25 Units into the skin 2 (two) times daily.   Yes [provider]  insulin regular (NOVOLIN R,HUMULIN R) 100 units/mL injection Inject 2-8 Units into the skin 3 (three) times daily. Per sliding scale   Yes [provider]  isosorbide mononitrate (IMDUR) 30 MG 24 hr tablet Take 0.5 tablets (15 mg total) by mouth daily. 05/21/18  Yes Mayo, Allyn Kenner, MD  lisinopril (PRINIVIL,ZESTRIL) 5 MG tablet Take 1 tablet (  5 mg total) by mouth daily. 05/21/18  Yes Mayo, Allyn Kenner, MD  metFORMIN (GLUCOPHAGE) 500 MG tablet Take 500 mg by mouth 2 (two) times daily with a meal.   Yes [provider]  oxyCODONE-acetaminophen (PERCOCET) 10-325 MG tablet Take 1 tablet by mouth every 6 (six) hours as needed for pain. 06/23/18  Yes Pyreddy, Vivien Rota, MD  rosuvastatin (CRESTOR) 20 MG tablet Take 1 tablet (20 mg total) by mouth daily at 6 PM. Patient taking differently: Take 20 mg by mouth at bedtime.  05/20/18  Yes Mayo, Allyn Kenner, MD  spironolactone (ALDACTONE)  25 MG tablet Take 0.5 tablets (12.5 mg total) by mouth daily. 05/20/18  Yes Mayo, Allyn Kenner, MD    Allergies Codeine; Fish oil; Penicillins; Simvastatin; Tramadol; and Tylenol [acetaminophen]  Family History  Problem Relation Age of Onset  . Heart disease Father   . Hypertension Brother   . Hyperlipidemia Brother   . Rheumatic fever Mother   . Mitral valve prolapse Mother     Social History Social History   Tobacco Use  . Smoking status: Current Every Day Smoker    Packs/day: 1.00    Types: Cigarettes  . Smokeless tobacco: Never Used  Substance Use Topics  . Alcohol use: No  . Drug use: Yes    Types: Cocaine    Comment: smoked on 06/24/2018    Review of Systems Constitutional: No fever/chills Eyes: No visual changes. ENT: No sore throat. Cardiovascular: Positive for  chest pain. Respiratory: Positive for shortness of breath. Gastrointestinal: No abdominal pain.  No nausea, no vomiting.  No diarrhea.  No constipation. Genitourinary: Negative for dysuria. Musculoskeletal: Negative for neck pain.  Negative for back pain. Integumentary: Negative for rash. Neurological: Negative for headaches, focal weakness or numbness.   ____________________________________________   PHYSICAL EXAM:  VITAL SIGNS: ED Triage Vitals  Enc Vitals Group     BP 06/25/18 0456 107/66     Pulse Rate 06/25/18 0456 83     Resp 06/25/18 0456 (!) 22     Temp 06/25/18 0456 97.6 F (36.4 C)     Temp Source 06/25/18 0456 Oral     SpO2 06/25/18 0456 (!) 88 %     Weight 06/25/18 0500 58.5 kg (129 lb)     Height 06/25/18 0500 1.727 m (5\' 8" )     Head Circumference --      Peak Flow --      Pain Score 06/25/18 0459 10     Pain Loc --      Pain Edu? --      Excl. in GC? --     Constitutional: Alert and oriented. Well appearing and in no acute distress. Mouth/Throat: Mucous membranes are moist. Oropharynx non-erythematous. Neck: No stridor. Cardiovascular: Normal rate, regular rhythm. Good  peripheral circulation. Grossly normal heart sounds. Respiratory: Normal respiratory effort.  No retractions. Lungs CTAB. Gastrointestinal: Soft and nontender. No distention.  Musculoskeletal: No lower extremity tenderness nor edema. No gross deformities of extremities. Neurologic:  Normal speech and language. No gross focal neurologic deficits are appreciated.  Skin:  Skin is warm, dry and intact. No rash noted. Psychiatric: Mood and affect are normal. Speech and behavior are normal.  ____________________________________________   LABS (all labs ordered are listed, but only abnormal results are displayed)  Labs Reviewed  GLUCOSE, CAPILLARY - Abnormal; Notable for the following components:      Result Value   Glucose-Capillary 513 (*)    All other components within normal limits  BASIC METABOLIC PANEL - Abnormal; Notable for the following components:   Sodium 128 (*)    Chloride 90 (*)    Glucose, Bld 512 (*)    BUN 25 (*)    All other components within normal limits  CBC - Abnormal; Notable for the following components:   WBC 11.7 (*)    RBC 3.89 (*)    HCT 39.2 (*)    MCV 100.8 (*)    MCH 34.9 (*)    All other components within normal limits  TROPONIN I - Abnormal; Notable for the following components:   Troponin I 5.48 (*)    All other components within normal limits  HEPARIN LEVEL (UNFRACTIONATED) - Abnormal; Notable for the following components:   Heparin Unfractionated <0.10 (*)    All other components within normal limits  URINE DRUG SCREEN, QUALITATIVE ([facility] ONLY) - Abnormal; Notable for the following components:   Cocaine Metabolite,Ur [place] POSITIVE (*)    Opiate, Ur Screen POSITIVE (*)    All other components within normal limits  TROPONIN I - Abnormal; Notable for the following components:   Troponin I 5.48 (*)    All other components within normal limits  TROPONIN I - Abnormal; Notable for the following components:   Troponin I 4.19 (*)    All other components  within normal limits  TROPONIN I - Abnormal; Notable for the following components:   Troponin I 5.37 (*)    All other components within normal limits  HEPARIN LEVEL (UNFRACTIONATED) - Abnormal; Notable for the following components:   Heparin Unfractionated <0.10 (*)    All other components within normal limits  GLUCOSE, CAPILLARY - Abnormal; Notable for the following components:   Glucose-Capillary 335 (*)    All other components within normal limits  GLUCOSE, CAPILLARY - Abnormal; Notable for the following components:   Glucose-Capillary 435 (*)    All other components within normal limits  HEPARIN LEVEL (UNFRACTIONATED) - Abnormal; Notable for the following components:   Heparin Unfractionated <0.10 (*)    All other components within normal limits  GLUCOSE, CAPILLARY - Abnormal; Notable for the following components:   Glucose-Capillary 280 (*)    All other components within normal limits  GLUCOSE, CAPILLARY - Abnormal; Notable for the following components:   Glucose-Capillary 340 (*)    All other components within normal limits  GLUCOSE, CAPILLARY - Abnormal; Notable for the following components:   Glucose-Capillary 385 (*)    All other components within normal limits  APTT  PROTIME-INR  T4, FREE  TSH   ____________________________________________  EKG  ED ECG REPORT I, Tompkins N Abrahan Fulmore, the attending physician, personally viewed and interpreted this ECG.   Date: 06/25/2018  EKG Time: 50 5 AM  Rate: 83  Rhythm: Atrial sensed ventricular paced rhythm  Axis: Left axis deviation  Intervals: Normal  ST&T Change: None  ____________________________________________  RADIOLOGY I, Lismore N Melisha Eggleton, personally viewed and evaluated these images (plain radiographs) as part of my medical decision making, as well as reviewing the written report by the radiologist.  ED MD interpretation: The megaly with findings consistent with congestive heart failure noted on chest x-ray per  radiologist.  Official radiology report(s): No results found.  ____________________________________________   PROCEDURES  Critical Care performed:   .Critical Care Performed by: Darci Current, MD Authorized by: Darci Current, MD   Critical care provider statement:    Critical care time (minutes):  30   Critical care time was exclusive of:  Separately  billable procedures and treating other patients and teaching time   Critical care was time spent personally by me on the following activities:  Development of treatment plan with patient or surrogate, discussions with consultants, evaluation of patient's response to treatment, examination of patient, obtaining history from patient or surrogate, ordering and performing treatments and interventions, ordering and review of laboratory studies, ordering and review of radiographic studies, pulse oximetry, re-evaluation of patient's condition and review of old charts   I assumed direction of critical care for this patient from another provider in my specialty: no       ____________________________________________   INITIAL IMPRESSION / ASSESSMENT AND PLAN / ED COURSE  As part of my medical decision making, I reviewed the following data within the electronic MEDICAL RECORD NUMBER   57 year old male presenting with above-stated history and physical exam secondary to chest pain and dyspnea.  Concern for possible ACS and as such EKG was performed which did not reveal any evidence of ischemia or infarction.  However patient's troponin elevated at 5.48 with ongoing chest pain despite receiving IV morphine.  As such patient discussed with Dr. Sheryle Hail for hospital admission further evaluation and management heparin administered.  ____________________________________________  FINAL CLINICAL IMPRESSION(S) / ED DIAGNOSES  Final diagnoses:  CHF (congestive heart failure) (HCC)  NSTEMI   MEDICATIONS GIVEN DURING THIS VISIT:  Medications    insulin aspart (novoLOG) injection 10 Units (10 Units Intravenous Given 06/25/18 0506)  diphenhydrAMINE (BENADRYL) injection 50 mg (50 mg Intravenous Given 06/25/18 0644)  heparin bolus via infusion 3,500 Units (3,500 Units Intravenous Bolus from Bag 06/25/18 0900)  insulin aspart (novoLOG) injection 16 Units (16 Units Subcutaneous Given 06/25/18 1301)  heparin bolus via infusion 1,800 Units (1,800 Units Intravenous Bolus from Bag 06/25/18 1632)  heparin bolus via infusion 2,000 Units (2,000 Units Intravenous Bolus from Bag 06/26/18 0444)     ED Discharge Orders    None       Note:  This document was prepared using Dragon voice recognition software and may include unintentional dictation errors.    Darci Current, MD 07/02/18 2206

## 2018-07-03 ENCOUNTER — Ambulatory Visit: Payer: Medicaid - Out of State | Admitting: Family

## 2018-07-03 LAB — CULTURE, BLOOD (ROUTINE X 2)
Culture: NO GROWTH
Culture: NO GROWTH
Special Requests: ADEQUATE
Special Requests: ADEQUATE

## 2018-07-22 ENCOUNTER — Ambulatory Visit: Payer: Medicaid - Out of State | Admitting: Cardiovascular Disease

## 2018-11-09 IMAGING — CR DG CHEST 2V
1 series · 2 of 2 positions shown · non-contrast
Comparison: Radiograph June 25, 2018.

CLINICAL DATA: Shortness of breath.

EXAM:
CHEST - 2 VIEW

[Series 1: x chest ap · 0.14mm/px · 2 of 2 slices shown]
[im 1/2]
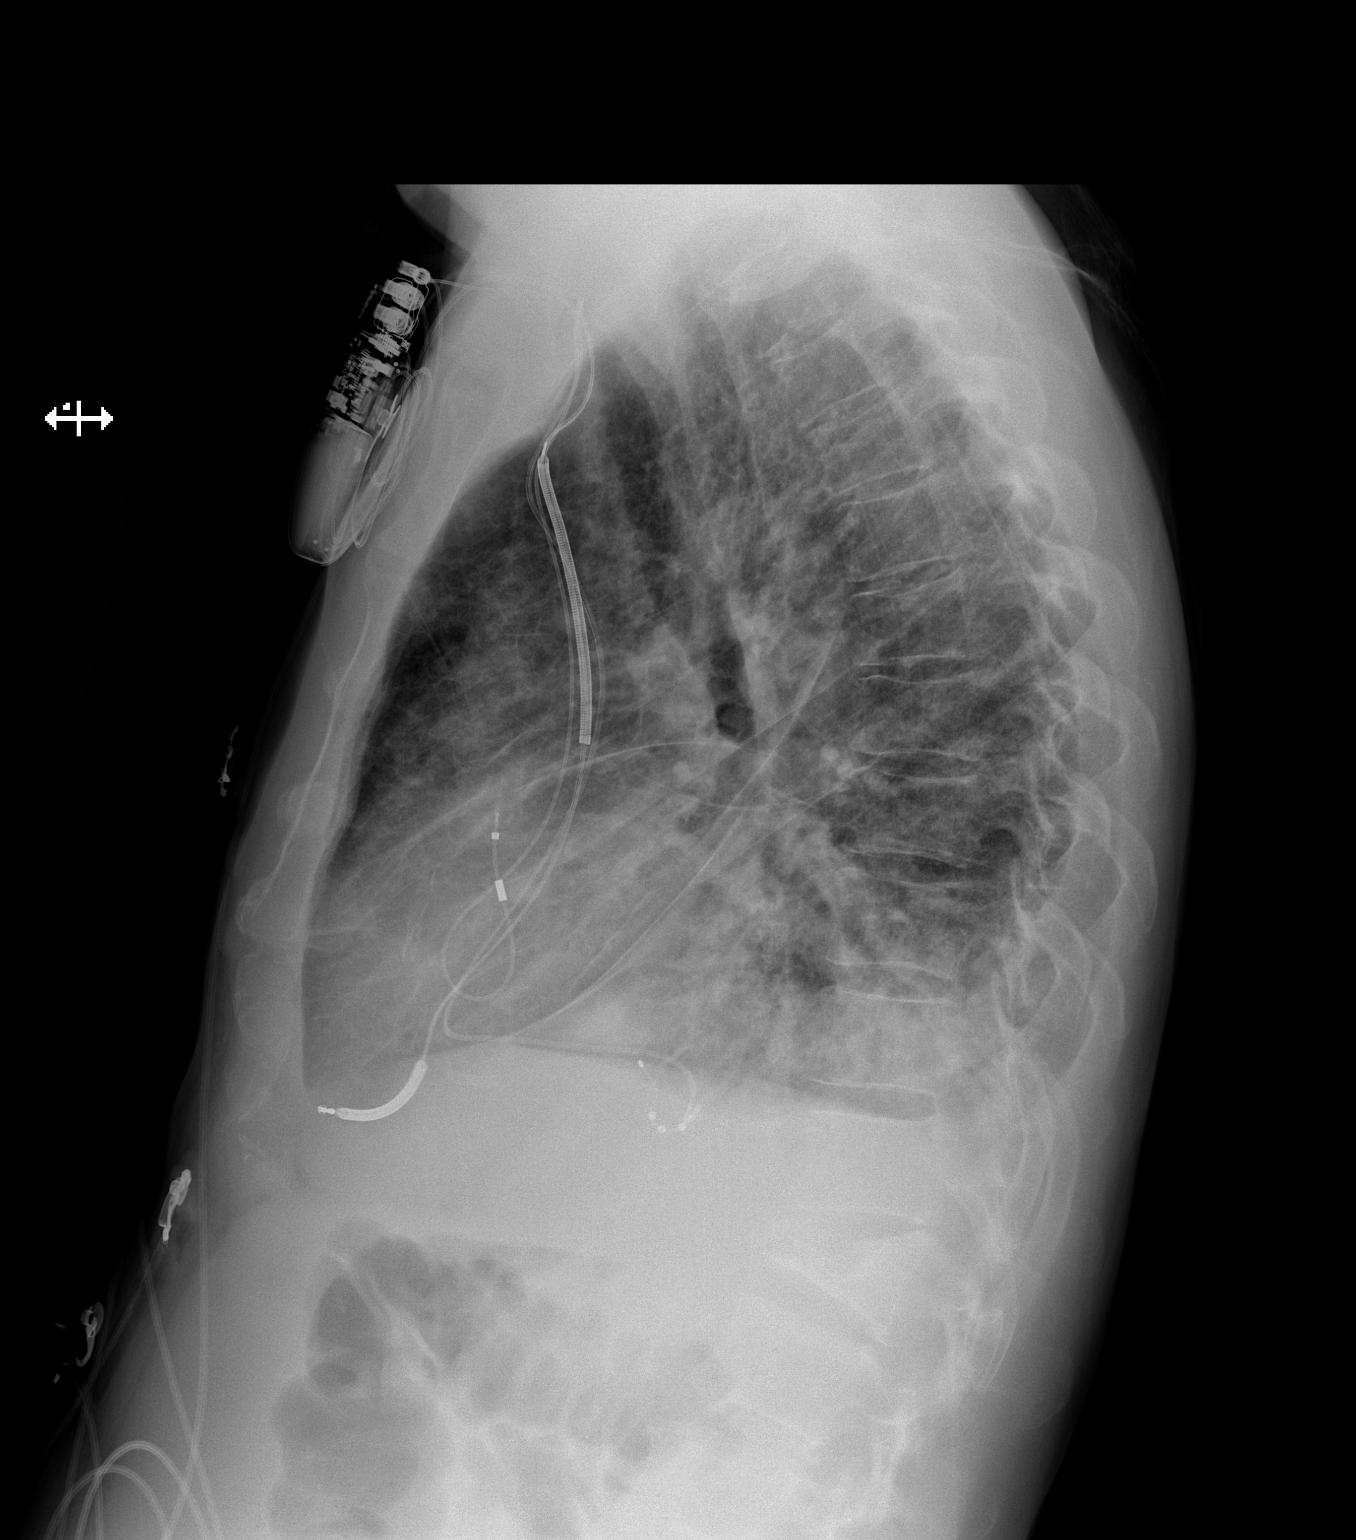
[im 2/2]
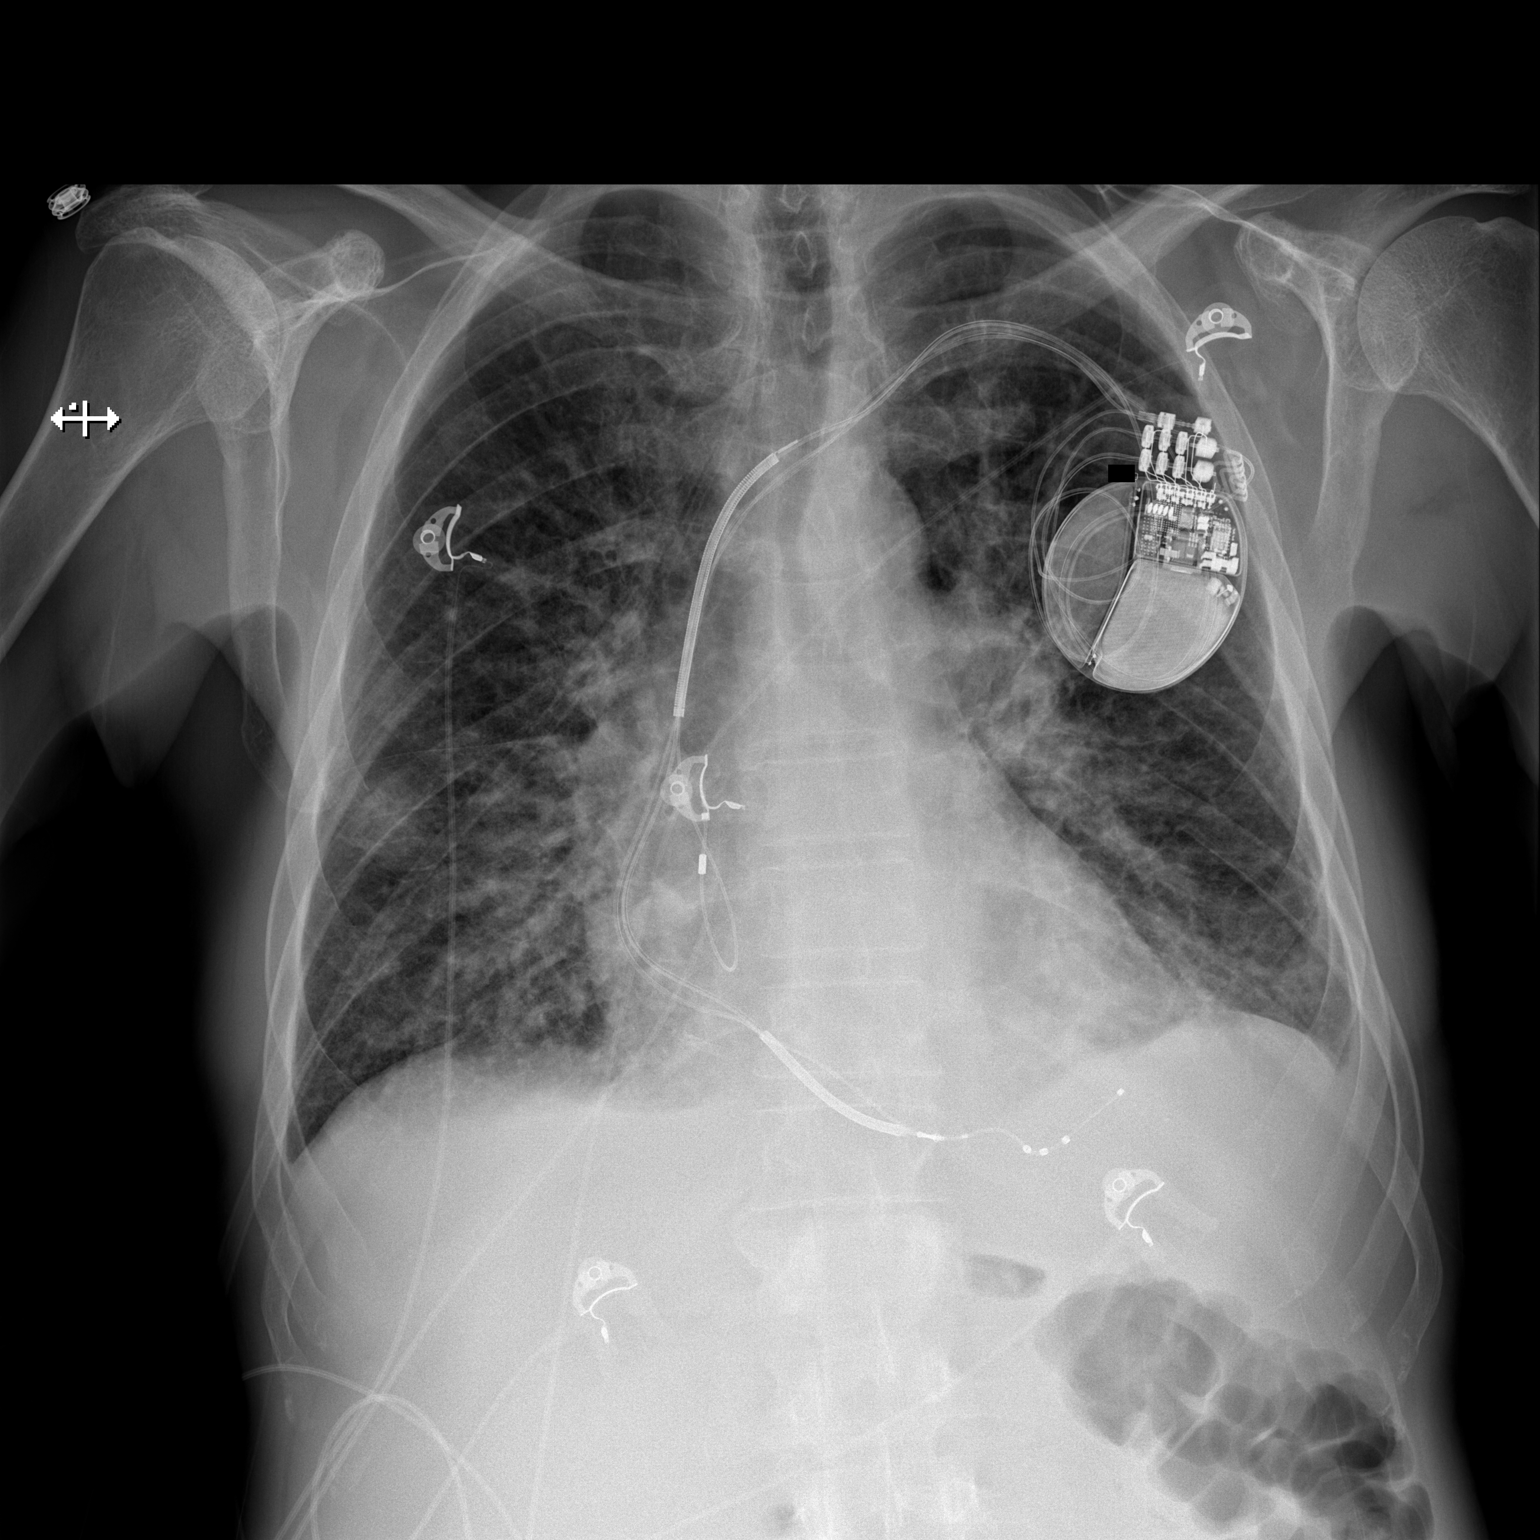

[2 of 2 positions shown; findings below may reference images not displayed]

FINDINGS: Stable cardiomediastinal silhouette. Stable central pulmonary
vascular congestion. Left-sided pacemaker is unchanged in position.
No pneumothorax is noted. Probable bilateral pulmonary edema is
noted with small pleural effusions. Bony thorax is unremarkable.
IMPRESSION: Stable central pulmonary vascular congestion and probable bilateral
pulmonary edema with small pleural effusions.

## 2018-11-11 IMAGING — DX DG CHEST 1V PORT
1 series · 1 of 1 positions shown · non-contrast
Comparison: 06/28/2018, 06/26/2018, 06/25/2018

CLINICAL DATA: Central line placement and intubated patient

EXAM:
PORTABLE CHEST 1 VIEW

[chest ap]
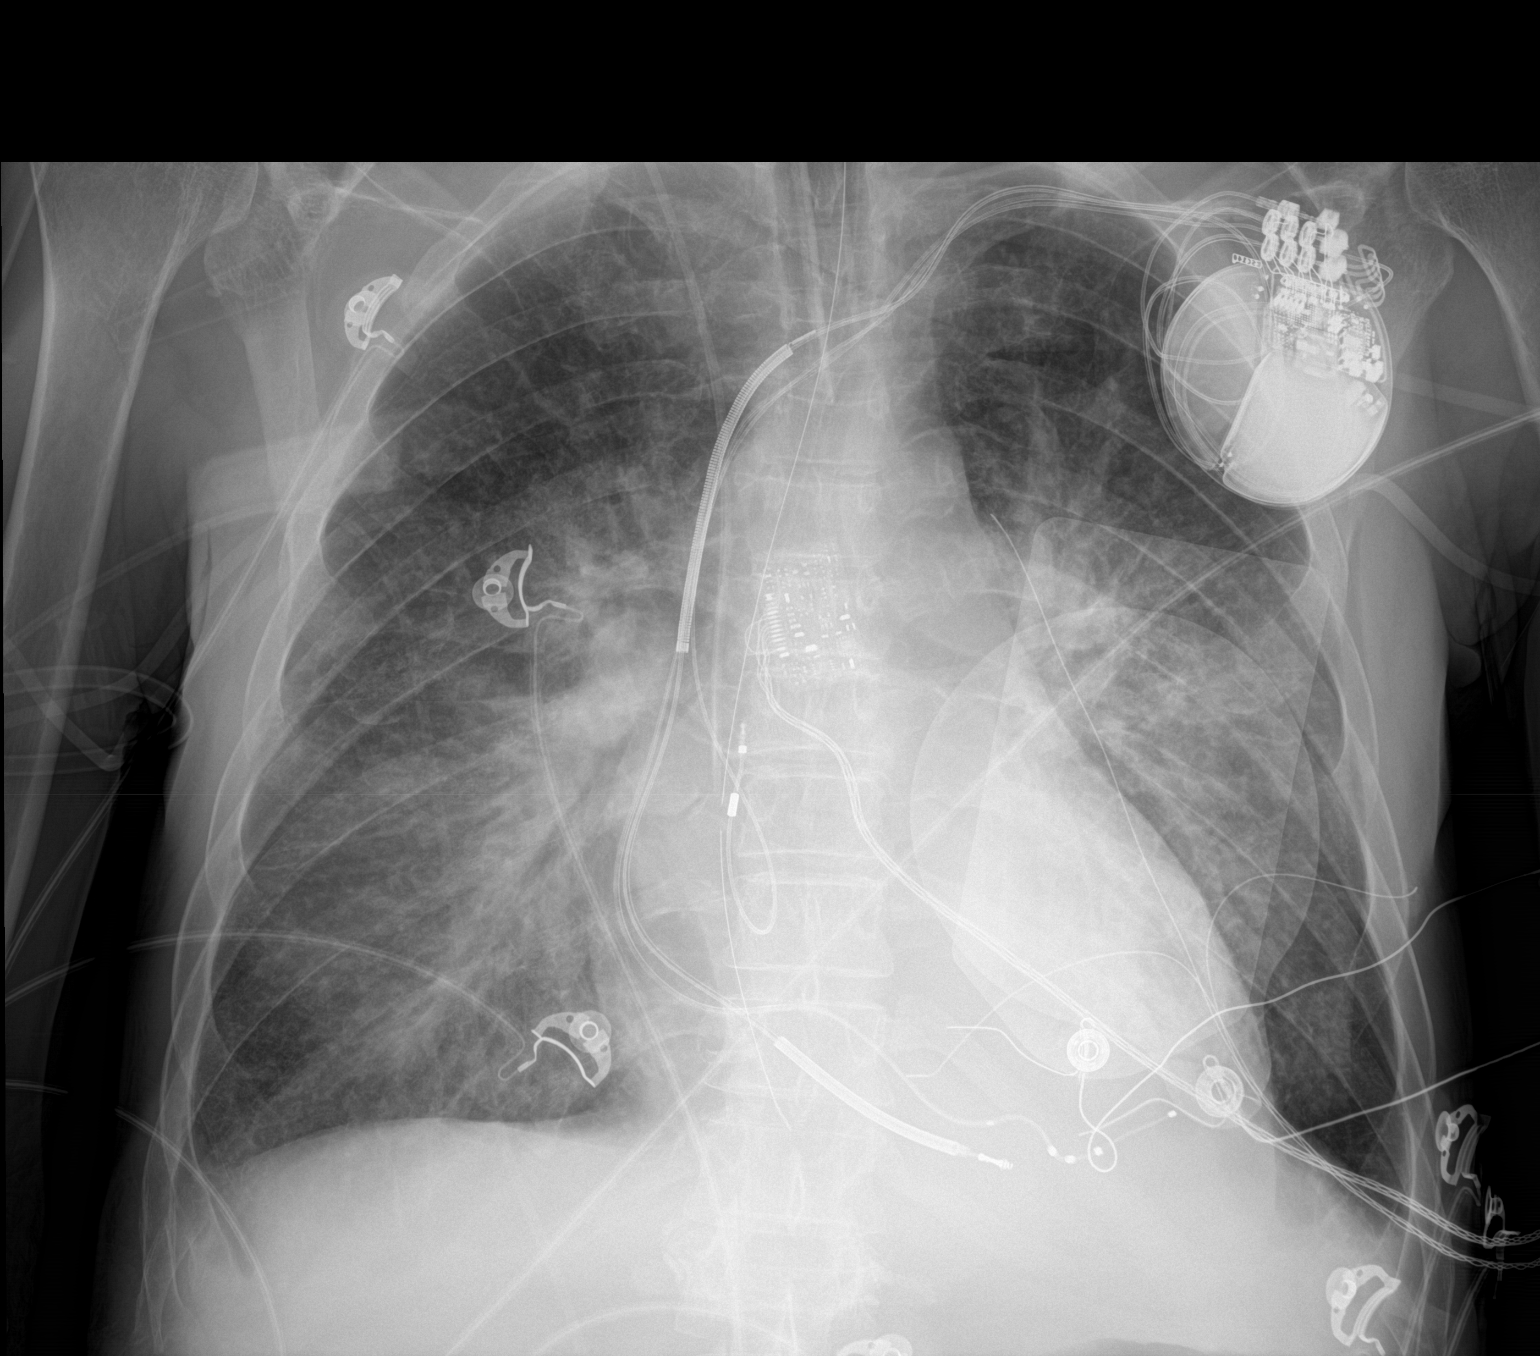

[1 of 1 positions shown; findings below may reference images not displayed]

FINDINGS: Left-sided pacing device as before. Endotracheal tube tip is about
3.9 cm superior to the carina. Esophageal tube tip projects over the
distal esophagus.

Right IJ central venous catheter tip at the cavoatrial junction. No
pneumothorax.

Cardiomegaly with vascular congestion and pulmonary edema. Small
pleural effusions. Consolidation at the left lung base.
IMPRESSION: 1. Endotracheal tube tip about 3.9 cm superior to the carina.
2. Right IJ central venous catheter tip overlies the cavoatrial
junction. No pneumothorax
3. Cardiomegaly with small pleural effusion, vascular congestion and
pulmonary edema. Worsening consolidation at the left lung base.

## 2018-11-11 IMAGING — DX DG ABDOMEN 1V
1 series · 1 of 1 positions shown · non-contrast
Comparison: 06/28/2018

CLINICAL DATA: OG tube placement

EXAM:
ABDOMEN - 1 VIEW

[chest ap]
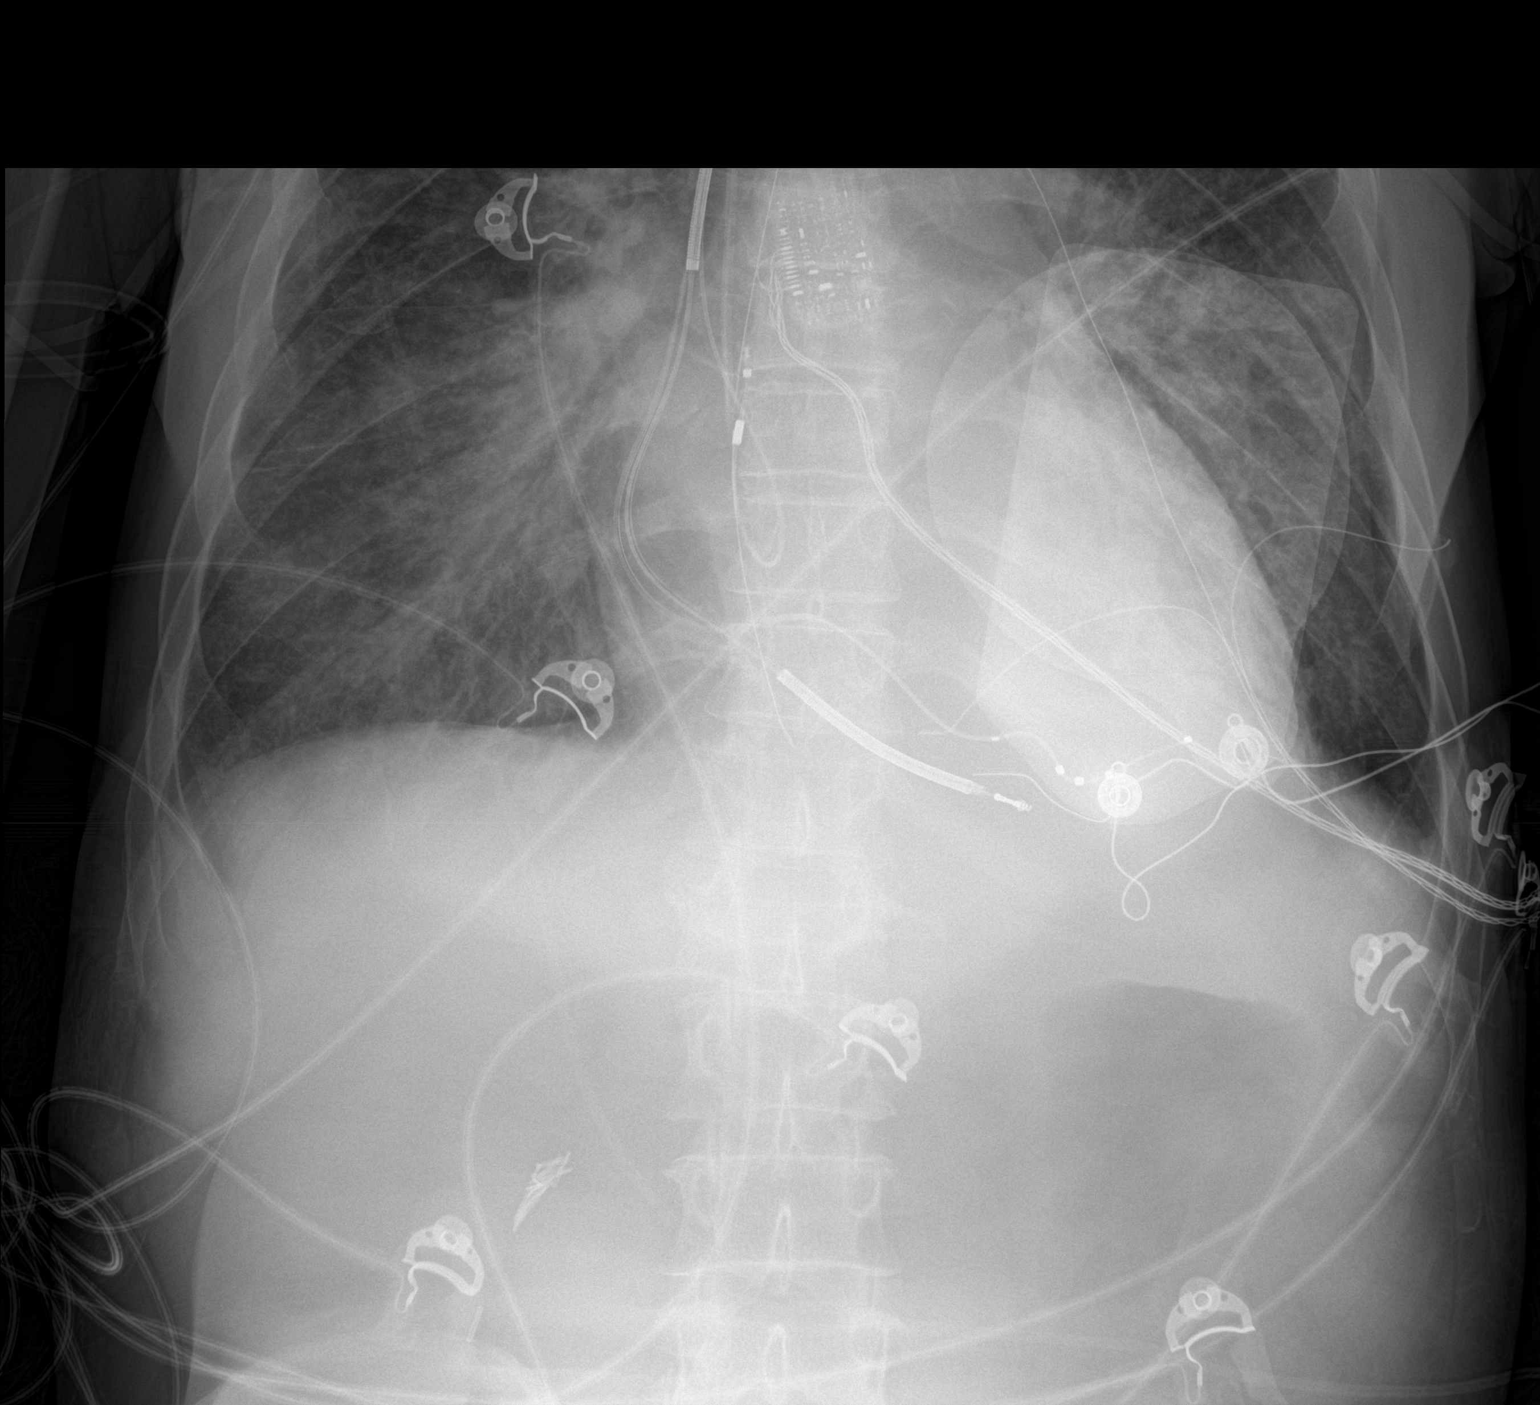

[1 of 1 positions shown; findings below may reference images not displayed]

FINDINGS: Partially visualized cardiac pacing leads. Esophageal tube tip
terminates at the distal esophagus. Small pleural effusions,
cardiomegaly, consolidation at the left base. Tip of the right
central venous catheter overlies the cavoatrial region
IMPRESSION: Tip of the esophageal tube overlies the distal esophagus, further
advancement suggested for more optimal positioning.

## 2018-11-12 IMAGING — DX DG ABDOMEN 1V
1 series · 1 of 1 positions shown · non-contrast
Comparison: Radiograph dated 06/28/2018

CLINICAL DATA: 49-year-old male with NG tube placement.

EXAM:
ABDOMEN - 1 VIEW

[abdomen supine]
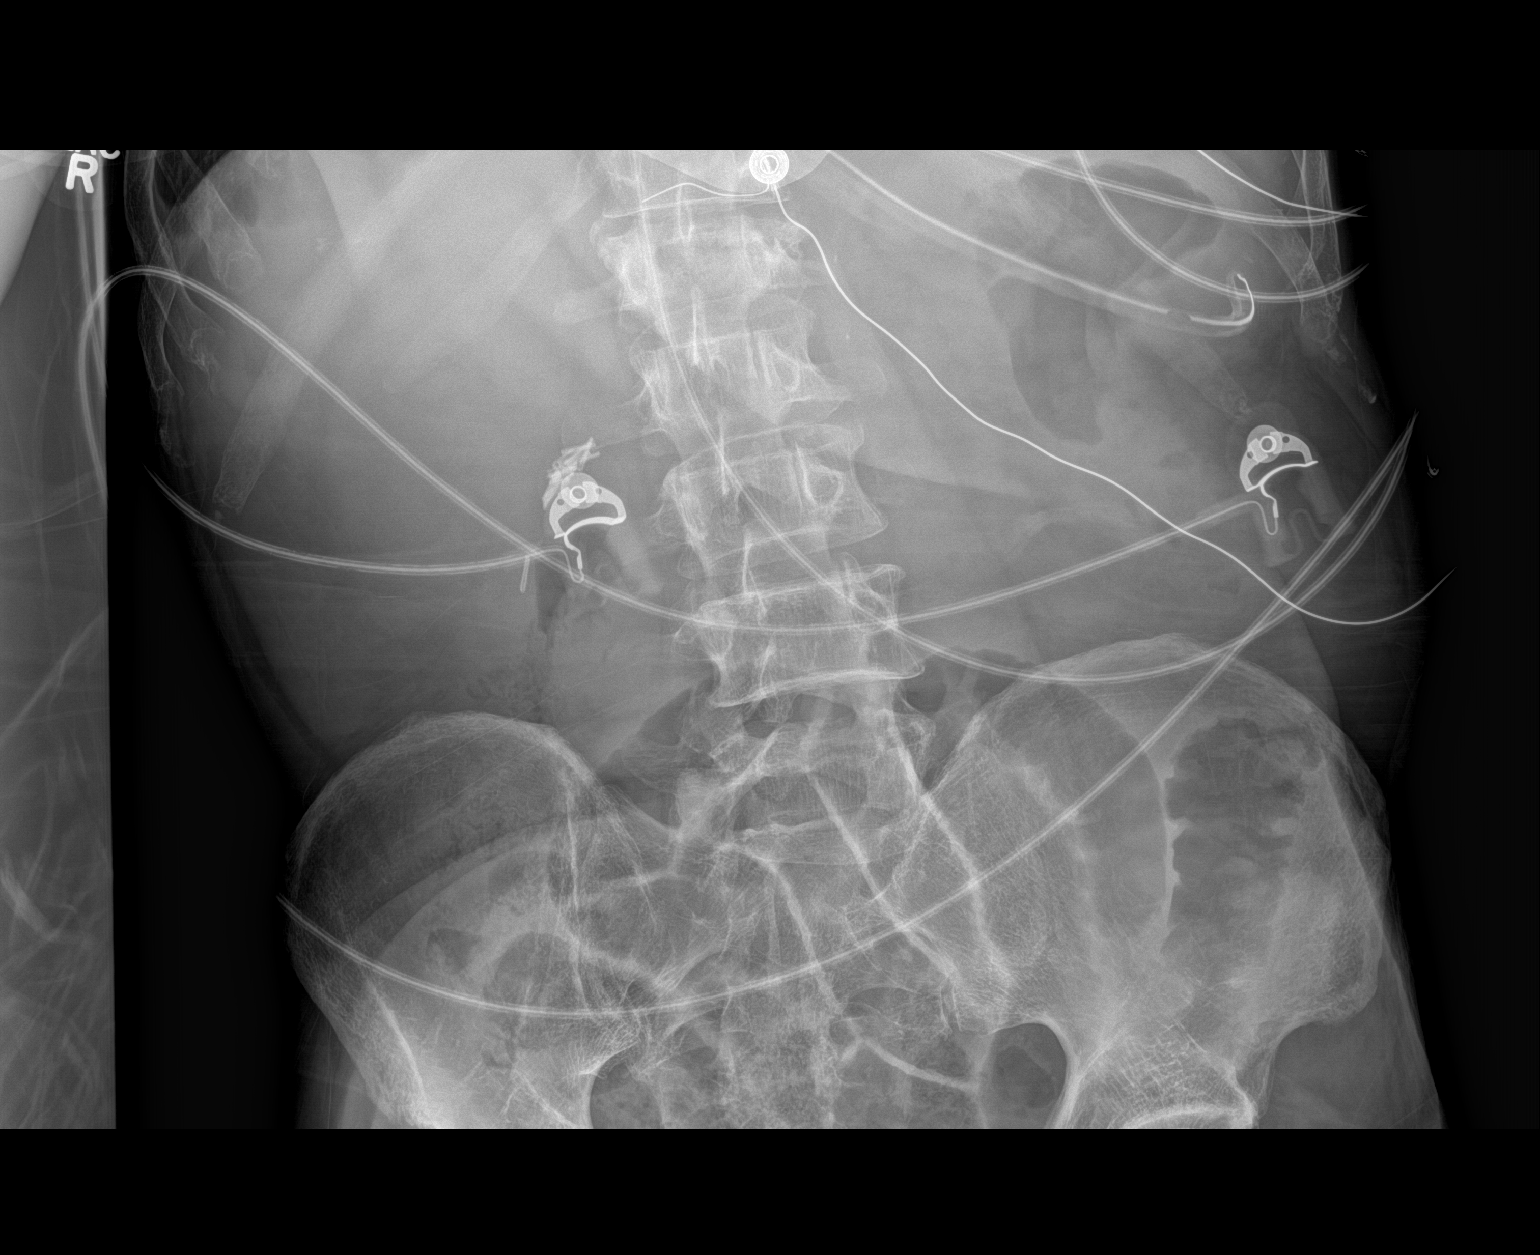

[1 of 1 positions shown; findings below may reference images not displayed]

FINDINGS: An enteric tube is partially visualized with tip and side-port in
the left upper abdomen likely in the proximal stomach. Mildly
dilated air-filled loops of small bowel in the lower abdomen and
pelvis measure up to 3 cm.
IMPRESSION: Enteric tube with tip likely in the proximal stomach.
